# Patient Record
Sex: Male | Born: 1970 | Race: White | Hispanic: No | Marital: Married | Smoking: Former smoker
Health system: Southern US, Community
[De-identification: ages and names within clinical notes are randomized; demographics above are authoritative.]

## PROBLEM LIST (undated history)

## (undated) DIAGNOSIS — M1990 Unspecified osteoarthritis, unspecified site: Secondary | ICD-10-CM

## (undated) DIAGNOSIS — M549 Dorsalgia, unspecified: Secondary | ICD-10-CM

## (undated) DIAGNOSIS — B0089 Other herpesviral infection: Secondary | ICD-10-CM

## (undated) HISTORY — DX: Unspecified osteoarthritis, unspecified site: M19.90

## (undated) HISTORY — PX: TONSILLECTOMY: SUR1361

---

## 2001-12-20 ENCOUNTER — Emergency Department (HOSPITAL_COMMUNITY): Admission: EM | Admit: 2001-12-20 | Discharge: 2001-12-20 | Payer: Self-pay | Admitting: *Deleted

## 2009-12-19 ENCOUNTER — Emergency Department (HOSPITAL_COMMUNITY): Admission: EM | Admit: 2009-12-19 | Discharge: 2009-12-19 | Payer: Self-pay | Admitting: Emergency Medicine

## 2010-07-22 ENCOUNTER — Emergency Department (HOSPITAL_COMMUNITY)
Admission: EM | Admit: 2010-07-22 | Discharge: 2010-07-22 | Disposition: A | Discharge status: Home / Self Care | Payer: Self-pay | Attending: Emergency Medicine | Admitting: Emergency Medicine

## 2010-07-22 DIAGNOSIS — T2124XA Burn of second degree of lower back, initial encounter: Secondary | ICD-10-CM | POA: Insufficient documentation

## 2010-07-22 DIAGNOSIS — X12XXXA Contact with other hot fluids, initial encounter: Secondary | ICD-10-CM | POA: Insufficient documentation

## 2010-07-22 DIAGNOSIS — T2121XA Burn of second degree of chest wall, initial encounter: Secondary | ICD-10-CM | POA: Insufficient documentation

## 2010-07-22 DIAGNOSIS — Y998 Other external cause status: Secondary | ICD-10-CM | POA: Insufficient documentation

## 2010-07-22 DIAGNOSIS — Y92009 Unspecified place in unspecified non-institutional (private) residence as the place of occurrence of the external cause: Secondary | ICD-10-CM | POA: Insufficient documentation

## 2012-05-28 ENCOUNTER — Emergency Department (HOSPITAL_COMMUNITY)
Admission: EM | Admit: 2012-05-28 | Discharge: 2012-05-28 | Disposition: A | Discharge status: Home / Self Care | Payer: Self-pay | Attending: Emergency Medicine | Admitting: Emergency Medicine

## 2012-05-28 ENCOUNTER — Encounter (HOSPITAL_COMMUNITY): Payer: Self-pay

## 2012-05-28 DIAGNOSIS — M5431 Sciatica, right side: Secondary | ICD-10-CM

## 2012-05-28 DIAGNOSIS — F17200 Nicotine dependence, unspecified, uncomplicated: Secondary | ICD-10-CM | POA: Insufficient documentation

## 2012-05-28 DIAGNOSIS — M5430 Sciatica, unspecified side: Secondary | ICD-10-CM | POA: Insufficient documentation

## 2012-05-28 HISTORY — DX: Dorsalgia, unspecified: M54.9

## 2012-05-28 MED ORDER — OXYCODONE-ACETAMINOPHEN 5-325 MG PO TABS: 1.0000 | ORAL_TABLET | ORAL | Status: DC | PRN

## 2012-05-28 MED ORDER — HYDROMORPHONE HCL PF 1 MG/ML IJ SOLN
1.0000 mg | Freq: Once | INTRAMUSCULAR | Status: AC
  Administered 2012-05-28: 1 mg via INTRAMUSCULAR
  Filled 2012-05-28: qty 1

## 2012-05-28 MED ORDER — CYCLOBENZAPRINE HCL 5 MG PO TABS: 5.0000 mg | ORAL_TABLET | Freq: Three times a day (TID) | ORAL | Status: DC | PRN

## 2012-05-28 MED ORDER — IBUPROFEN 600 MG PO TABS: 600.0000 mg | ORAL_TABLET | Freq: Four times a day (QID) | ORAL | Status: DC | PRN

## 2012-05-28 NOTE — ED Notes (Signed)
Pt denies any urinary s/s.

## 2012-05-28 NOTE — ED Notes (Signed)
Pt reports low back pain, thinks may by his sciatica acting up, denies any recent injury

## 2012-05-28 NOTE — ED Provider Notes (Signed)
History     CSN: [id number]  Arrival date & time 05/28/12  1507   First MD Initiated Contact with Patient 05/28/12 1759      Chief Complaint  Patient presents with  . Back Pain    (Consider location/radiation/quality/duration/timing/severity/associated sxs/prior treatment) Patient is a 42 y.o. male presenting with back pain. The history is provided by the patient and the spouse.  Back Pain Location:  Lumbar spine Quality:  Aching and shooting Radiates to:  R knee Pain severity:  Severe Pain is:  Same all the time Onset quality:  Gradual (Patient woke with pain 2 days ago.  He denies injury) Timing:  Constant Progression:  Worsening Chronicity:  Recurrent Relieved by: Pain is improved with walking and standing but not resolved. Worsened by:  Sitting and lying down (Lying still or sitting in one position for any length of time worsens the pain.) Ineffective treatments:  NSAIDs Associated symptoms: no abdominal pain, no bladder incontinence, no bowel incontinence, no chest pain, no dysuria, no fever, no numbness, no paresthesias, no perianal numbness, no tingling and no weakness   Associated symptoms comment:  Np bladder or urinary retention  Risk factors: no hx of cancer     Past Medical History  Diagnosis Date  . Back pain     Past Surgical History  Procedure Laterality Date  . Tonsillectomy      No family history on file.  History  Substance Use Topics  . Smoking status: Current Every Day Smoker    Types: Cigarettes  . Smokeless tobacco: Not on file  . Alcohol Use: Yes      Review of Systems  Constitutional: Negative for fever.  Respiratory: Negative for shortness of breath.   Cardiovascular: Negative for chest pain and leg swelling.  Gastrointestinal: Negative for abdominal pain, constipation, abdominal distention and bowel incontinence.  Genitourinary: Negative for bladder incontinence, dysuria, urgency, frequency, flank pain and difficulty urinating.   Musculoskeletal: Positive for back pain. Negative for joint swelling and gait problem.  Skin: Negative for rash.  Neurological: Negative for tingling, weakness, numbness and paresthesias.    Allergies  Review of patient's allergies indicates no known allergies.  Home Medications   Current Outpatient Rx  Name  Route  Sig  Dispense  Refill  . cyclobenzaprine (FLEXERIL) 5 MG tablet   Oral   Take 1 tablet (5 mg total) by mouth 3 (three) times daily as needed for muscle spasms.   15 tablet   0   . ibuprofen (ADVIL,MOTRIN) 600 MG tablet   Oral   Take 1 tablet (600 mg total) by mouth every 6 (six) hours as needed for pain.   30 tablet   0   . oxyCODONE-acetaminophen (PERCOCET/ROXICET) 5-325 MG per tablet   Oral   Take 1 tablet by mouth every 4 (four) hours as needed for pain.   20 tablet   0     BP 148/88  Pulse 77  Temp(Src) 97.7 F (36.5 C) (Oral)  Resp 20  Ht 5\' 11"  (1.803 m)  Wt 185 lb (83.915 kg)  BMI 25.81 kg/m2  SpO2 98%  Physical Exam  Nursing note and vitals reviewed. Constitutional: He appears well-developed and well-nourished.  HENT:  Head: Normocephalic.  Eyes: Conjunctivae are normal.  Neck: Normal range of motion. Neck supple.  Cardiovascular: Normal rate and intact distal pulses.   Pedal pulses normal.  Pulmonary/Chest: Effort normal.  Abdominal: Soft. Bowel sounds are normal. He exhibits no distension and no mass.  Musculoskeletal: Normal  range of motion. He exhibits tenderness. He exhibits no edema.       Lumbar back: He exhibits tenderness. He exhibits no bony tenderness, no swelling, no edema and no spasm.  Right SI joint ttp.  Neurological: He is alert. He has normal strength. He displays no atrophy and no tremor. No sensory deficit. Gait normal.  Reflex Scores:      Patellar reflexes are 2+ on the right side and 2+ on the left side.      Achilles reflexes are 2+ on the right side and 2+ on the left side. No strength deficit noted in hip and  knee flexor and extensor muscle groups.  Ankle flexion and extension intact.  Skin: Skin is warm and dry.  Psychiatric: He has a normal mood and affect.    ED Course  Procedures (including critical care time)  Labs Reviewed - No data to display No results found.   1. Sciatica, right       MDM  No neuro deficit on exam or by history to suggest emergent or surgical presentation.  Also discussed worsened sx that should prompt immediate re-evaluation including distal weakness, bowel/bladder retention/incontinence.  Pt prescribed ibuprofen,  Flexeril, percocet.  Encouraged heat,  Avoid activity that worsens pain.  Recheck in 1 week if not improved.              Burgess Amor, PA-C 05/28/12 1843

## 2012-05-28 NOTE — ED Provider Notes (Signed)
Medical screening examination/treatment/procedure(s) were performed by non-physician practitioner and as supervising physician I was immediately available for consultation/collaboration.   Shelda Jakes, MD 05/28/12 [phone]

## 2013-04-30 ENCOUNTER — Encounter (HOSPITAL_COMMUNITY): Payer: Self-pay | Admitting: Emergency Medicine

## 2013-04-30 ENCOUNTER — Emergency Department (HOSPITAL_COMMUNITY)
Admission: EM | Admit: 2013-04-30 | Discharge: 2013-04-30 | Disposition: A | Discharge status: Home / Self Care | Payer: Self-pay | Attending: Emergency Medicine | Admitting: Emergency Medicine

## 2013-04-30 DIAGNOSIS — R21 Rash and other nonspecific skin eruption: Secondary | ICD-10-CM | POA: Insufficient documentation

## 2013-04-30 DIAGNOSIS — Z79899 Other long term (current) drug therapy: Secondary | ICD-10-CM | POA: Insufficient documentation

## 2013-04-30 DIAGNOSIS — M7989 Other specified soft tissue disorders: Secondary | ICD-10-CM | POA: Insufficient documentation

## 2013-04-30 DIAGNOSIS — Z792 Long term (current) use of antibiotics: Secondary | ICD-10-CM | POA: Insufficient documentation

## 2013-04-30 DIAGNOSIS — F17200 Nicotine dependence, unspecified, uncomplicated: Secondary | ICD-10-CM | POA: Insufficient documentation

## 2013-04-30 DIAGNOSIS — Z791 Long term (current) use of non-steroidal anti-inflammatories (NSAID): Secondary | ICD-10-CM | POA: Insufficient documentation

## 2013-04-30 MED ORDER — NAPROXEN 250 MG PO TABS
500.0000 mg | ORAL_TABLET | Freq: Once | ORAL | Status: AC
  Administered 2013-04-30: 500 mg via ORAL
  Filled 2013-04-30: qty 2

## 2013-04-30 MED ORDER — NAPROXEN 500 MG PO TABS: 500.0000 mg | ORAL_TABLET | Freq: Two times a day (BID) | ORAL | Status: DC

## 2013-04-30 MED ORDER — SULFAMETHOXAZOLE-TRIMETHOPRIM 800-160 MG PO TABS: 1.0000 | ORAL_TABLET | Freq: Two times a day (BID) | ORAL | Status: DC

## 2013-04-30 MED ORDER — TRAMADOL HCL 50 MG PO TABS: 50.0000 mg | ORAL_TABLET | Freq: Four times a day (QID) | ORAL | Status: DC | PRN

## 2013-04-30 NOTE — ED Provider Notes (Signed)
CSN: [id number]     Arrival date & time 04/30/13  0522 History   First MD Initiated Contact with Patient 04/30/13 [phone]     Chief Complaint  Patient presents with  . Leg Pain   (Consider location/radiation/quality/duration/timing/severity/associated sxs/prior Treatment) HPI Comments: Pt presents with RLE swelling distal leg - X 10 days - gradual onset, persistent, worse with ambulation - walks 3-4 miles per day at work.  No injuries, no fevers, slight red rash over the area.  No fever.  No other sig PMH.  No NSAID relief pta.  He denies any other risk factors for deep venous thrombosis  Patient is a 43 y.o. male presenting with leg pain. The history is provided by the patient.  Leg Pain   Past Medical History  Diagnosis Date  . Back pain    Past Surgical History  Procedure Laterality Date  . Tonsillectomy     No family history on file. History  Substance Use Topics  . Smoking status: Current Every Day Smoker    Types: Cigarettes  . Smokeless tobacco: Not on file  . Alcohol Use: Yes    Review of Systems  Cardiovascular: Positive for leg swelling.  Musculoskeletal: Positive for gait problem.  Skin: Positive for rash.    Allergies  Review of patient's allergies indicates no known allergies.  Home Medications   Current Outpatient Rx  Name  Route  Sig  Dispense  Refill  . cyclobenzaprine (FLEXERIL) 5 MG tablet   Oral   Take 1 tablet (5 mg total) by mouth 3 (three) times daily as needed for muscle spasms.   15 tablet   0   . ibuprofen (ADVIL,MOTRIN) 600 MG tablet   Oral   Take 1 tablet (600 mg total) by mouth every 6 (six) hours as needed for pain.   30 tablet   0   . naproxen (NAPROSYN) 500 MG tablet   Oral   Take 1 tablet (500 mg total) by mouth 2 (two) times daily with a meal.   30 tablet   0   . oxyCODONE-acetaminophen (PERCOCET/ROXICET) 5-325 MG per tablet   Oral   Take 1 tablet by mouth every 4 (four) hours as needed for pain.   20 tablet   0   .  sulfamethoxazole-trimethoprim (SEPTRA DS) 800-160 MG per tablet   Oral   Take 1 tablet by mouth every 12 (twelve) hours.   20 tablet   0   . traMADol (ULTRAM) 50 MG tablet   Oral   Take 1 tablet (50 mg total) by mouth every 6 (six) hours as needed.   15 tablet   0    BP 130/88  Pulse 69  Temp(Src) 98.7 F (37.1 C) (Oral)  Resp 18  Ht 5\' 11"  (1.803 m)  Wt 185 lb (83.915 kg)  BMI 25.81 kg/m2  SpO2 100% Physical Exam  Nursing note and vitals reviewed. Constitutional: He appears well-developed and well-nourished. No distress.  HENT:  Head: Normocephalic and atraumatic.  Eyes: Conjunctivae are normal. Right eye exhibits no discharge. Left eye exhibits no discharge. No scleral icterus.  Cardiovascular: Normal rate and regular rhythm.   Normal pulses at the dorsalis pedis and posterior tibial arteries bilaterally, normal capillary refill  Pulmonary/Chest: Effort normal and breath sounds normal.  Musculoskeletal: Normal range of motion. He exhibits edema (scant pitting edema to the right lower extremity from the mid shin through the ankle, no edema to the left lower extremity).  Neurological: He is alert. Coordination normal.  Normal straight leg raise bilaterally, normal strength at the hips knees and ankles bilaterally, clear speech  Skin: Skin is warm and dry. There is erythema ( Slight erythema overlying the right distal anterior lower extremity just above the ankle, no induration, no increased warmth, no petechiae or purpura).    ED Course  Procedures (including critical care time) Labs Review Labs Reviewed - No data to display Imaging Review No results found.  EKG Interpretation   None       MDM   1. Swelling of right lower extremity    I have personally performed a bedside ultrasound compressibility study of the veins of the right lower extremity. He has no obvious DVT in his right common femoral, superficial femoral, femoral artery tracking all the way to the  knee and from the knee through the cath. These veins appear normal, no clot seen, minimal tenderness with manipulation of the ankle and right lower extremity. At this time I am unsure of the exact etiology of the patient's pain however he has not had any bony injury, his compartments are soft and not consistent with a compartment syndrome, this could be early cellulitis, will recommend minimal weightbearing for the next couple of days, rice therapy, antibiotics if redness or fevers develop, followup with family doctor for recheck. Patient is in agreement with the plan  Meds given in ED:  Medications  naproxen (NAPROSYN) tablet 500 mg (500 mg Oral Given 04/30/13 0637)    Discharge Medication List as of 04/30/2013  6:17 AM    START taking these medications   Details  naproxen (NAPROSYN) 500 MG tablet Take 1 tablet (500 mg total) by mouth 2 (two) times daily with a meal., Starting 04/30/2013, Until Discontinued, Print    sulfamethoxazole-trimethoprim (SEPTRA DS) 800-160 MG per tablet Take 1 tablet by mouth every 12 (twelve) hours., Starting 04/30/2013, Until Discontinued, Print    traMADol (ULTRAM) 50 MG tablet Take 1 tablet (50 mg total) by mouth every 6 (six) hours as needed., Starting 04/30/2013, Until Discontinued, Print            Wesley Roller, MD 05/01/13 [phone]

## 2013-04-30 NOTE — Discharge Instructions (Signed)
Your leg is slightly swollen - we are unsure of the exact cause of the swelling at this time but if it continues to swell or worsens, if you develop fevers or redness, start taking the antibiotic and see your family doctor for a recheck.  I have done a bedside limited ultrasound and i see no signs of blood clot.  Please follow RICE therapy guidelines attached, walk with the special boot when needed, use naprosyn twice a day.  Please call your doctor for a followup appointment within 24-48 hours. When you talk to your doctor please let them know that you were seen in the emergency department and have them acquire all of your records so that they can discuss the findings with you and formulate a treatment plan to fully care for your new and ongoing problems.  [facility]    Kari BaarsEdward Hawkins MD. Specialty: Pulmonary Disease Contact information: [address]  [address]  [place] [address]  [phone]   Syliva OvermanMargaret Simpson, MD. Specialty: [facility] Contact information: [address]  [place] [address]  [phone]   Lilyan PuntScott Luking, MD. Specialty: [facility] Contact information: [address]  [address]  [place] [address]  [phone]   Avon Gullyesfaye Fanta, MD Specialty: Internal Medicine Contact information: [address]  [place] [address]  [phone]   Catalina PizzaZach Hall, MD. Specialty: Internal Medicine Contact information: [address]  [place] [address]  [phone]   Butch PennyAngus Mcinnis, MD. Specialty: Family Medicine Contact information: [address]  [place] [address]  [phone]   John GiovanniStephen Knowlton, MD. Specialty: [facility] Contact information: [address]  [address]  [place] [address]  [phone]   Carylon Perchesoy Fagan, MD. Specialty: Internal Medicine Contact information: [address]  [address]  [place] [address]  [phone]

## 2013-04-30 NOTE — ED Notes (Signed)
Right leg pain that started about 10 days ago per pt. Hurts in the lower leg per pt.

## 2013-12-07 ENCOUNTER — Encounter (HOSPITAL_COMMUNITY): Payer: Self-pay | Admitting: Emergency Medicine

## 2013-12-07 ENCOUNTER — Emergency Department (HOSPITAL_COMMUNITY)
Admission: EM | Admit: 2013-12-07 | Discharge: 2013-12-07 | Disposition: A | Discharge status: Home / Self Care | Payer: Self-pay | Attending: Emergency Medicine | Admitting: Emergency Medicine

## 2013-12-07 DIAGNOSIS — Z791 Long term (current) use of non-steroidal anti-inflammatories (NSAID): Secondary | ICD-10-CM | POA: Insufficient documentation

## 2013-12-07 DIAGNOSIS — B0089 Other herpesviral infection: Secondary | ICD-10-CM

## 2013-12-07 DIAGNOSIS — B0230 Zoster ocular disease, unspecified: Secondary | ICD-10-CM | POA: Insufficient documentation

## 2013-12-07 DIAGNOSIS — R21 Rash and other nonspecific skin eruption: Secondary | ICD-10-CM | POA: Insufficient documentation

## 2013-12-07 DIAGNOSIS — Z792 Long term (current) use of antibiotics: Secondary | ICD-10-CM | POA: Insufficient documentation

## 2013-12-07 DIAGNOSIS — F17200 Nicotine dependence, unspecified, uncomplicated: Secondary | ICD-10-CM | POA: Insufficient documentation

## 2013-12-07 HISTORY — DX: Other herpesviral infection: B00.89

## 2013-12-07 MED ORDER — FLUORESCEIN SODIUM 1 MG OP STRP
1.0000 | ORAL_STRIP | Freq: Once | OPHTHALMIC | Status: AC
  Administered 2013-12-07: 1 via OPHTHALMIC
  Filled 2013-12-07: qty 1

## 2013-12-07 MED ORDER — ACYCLOVIR 800 MG PO TABS: 800.0000 mg | ORAL_TABLET | Freq: Every day | ORAL | Status: DC

## 2013-12-07 MED ORDER — TETRACAINE HCL 0.5 % OP SOLN
1.0000 [drp] | Freq: Once | OPHTHALMIC | Status: AC
  Administered 2013-12-07: 1 [drp] via OPHTHALMIC
  Filled 2013-12-07: qty 2

## 2013-12-07 NOTE — Discharge Instructions (Signed)
Viral Exanthems, Adult Many viral infections of the skin are called viral exanthems. Exanthem is another name for a rash or skin eruption. The most common viral exanthems include the following:  [place] measles or rubella.  Measles or rubeola.  Roseola.  Parvovirus B19 (Erythema infectiosum or Fifth disease).  Chickenpox or varicella. DIAGNOSIS  Sometimes, other problems may cause a rash that looks like a viral exanthem. Most often, your caregiver can determine whether you have a viral exanthem by looking at the rash. They usually have distinct patterns or appearance. Lab work may be done if the diagnosis is uncertain. Sometimes, a small tissue sample (biopsy) of the rash may need to be taken. TREATMENT  Immunizations have led to a decrease in the number of cases of measles, mumps, and rubella. Viral exanthems may require clinical treatment if a bacterial infection or other problems follow. The rash may be associated with:  Minor sore throat.  Aches and pains.  Runny nose.  Watery eyes.  Tiredness.  Some coughs.  Gastrointestinal infections causing nausea, vomiting, and diarrhea. Viral exanthems do not respond to antibiotic medicines, because they are not caused by bacteria. HOME CARE INSTRUCTIONS   Only take over-the-counter or prescription medicines for pain, discomfort, diarrhea, or fever as directed by your caregiver.  Drink enough water and fluids to keep your urine clear or pale yellow. SEEK MEDICAL CARE IF:  You develop swollen neck glands. This may feel like lumps or bumps in the neck.  You develop tenderness over your sinuses.  You are not feeling partly better after 3 days.  You develop muscle aches.  You are feeling more tired than you would expect.  You get a persistent cough with mucus. SEEK IMMEDIATE MEDICAL CARE IF:   You have a fever.  You develop worsened red eyes or eye pain.  You develop sores in your mouth and difficulty drinking or  eating.  You develop a sore throat with pus and difficulty swallowing.  You develop neck pain or a stiff neck.  You develop a severe headache.  You develop vomiting that will not stop. Document Released: 05/29/2002 Document Revised: 05/31/2011 Document Reviewed: 05/26/2010 [facility] Patient Information 2015 [place], [place]. This information is not intended to replace advice given to you by your health care provider. Make sure you discuss any questions you have with your health care provider.

## 2013-12-07 NOTE — ED Notes (Addendum)
Pt reports rash to face since Tuesday. Pt reports history of same and reports "has herpetic lesion outbreaks." nad noted. Pt alert and oriented. Airway patent.

## 2013-12-07 NOTE — ED Notes (Signed)
Left eye  20/40  Rt eye 20/15  Both eyes 20/15   With glasses

## 2013-12-07 NOTE — ED Notes (Signed)
Rash to forehead with redness to eyes. Pt says he has had similar outbreak several years ago that was "herpetic" and treated with famvir.  ? .

## 2013-12-08 NOTE — ED Provider Notes (Signed)
CSN: [id number]     Arrival date & time 12/07/13  1706 History   First MD Initiated Contact with Patient 12/07/13 1801     Chief Complaint  Patient presents with  . Rash     (Consider location/radiation/quality/duration/timing/severity/associated sxs/prior Treatment) The history is provided by the patient.   Wesley Banks is a 43 y.o. 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  He describes a blister type lesion that pops, drains a clear fluid then scabs over and has been present for the past 3 days.  He reports a similar outbreak like this several years ago and was diagnosed with a viral infection which got better with famvir.  He denies fevers or chills, no headache.  He has bilateral eye redness and slight irritation as well, denies eye pain and has had no visual changes.  He has taken no medicines for this complaint.     Past Medical History  Diagnosis Date  . Back pain   . Herpetic dermatitis    Past Surgical History  Procedure Laterality Date  . Tonsillectomy     History reviewed. No pertinent family history. History  Substance Use Topics  . Smoking status: Current Every Day Smoker -- 1.00 packs/day    Types: Cigarettes  . Smokeless tobacco: Not on file  . Alcohol Use: Yes     Comment: occ    Review of Systems  Constitutional: Negative for fever and chills.  Eyes: Positive for redness. Negative for visual disturbance.  Respiratory: Negative for shortness of breath and wheezing.   Skin: Positive for rash.  Neurological: Negative for numbness.      Allergies  Review of patient's allergies indicates no known allergies.  Home Medications   Prior to Admission medications   Medication Sig Start Date End Date Taking? Authorizing Provider  acyclovir (ZOVIRAX) 800 MG tablet Take 1 tablet (800 mg total) by  mouth 5 (five) times daily. 12/07/13   Burgess Amor, PA-C  cyclobenzaprine (FLEXERIL) 5 MG tablet Take 1 tablet (5 mg total) by mouth 3 (three) times daily as needed for muscle spasms. 05/28/12   Burgess Amor, PA-C  ibuprofen (ADVIL,MOTRIN) 600 MG tablet Take 1 tablet (600 mg total) by mouth every 6 (six) hours as needed for pain. 05/28/12   Burgess Amor, PA-C  naproxen (NAPROSYN) 500 MG tablet Take 1 tablet (500 mg total) by mouth 2 (two) times daily with a meal. 04/30/13   [name], MD  oxyCODONE-acetaminophen (PERCOCET/ROXICET) 5-325 MG per tablet Take 1 tablet by mouth every 4 (four) hours as needed for pain. 05/28/12   Burgess Amor, PA-C  sulfamethoxazole-trimethoprim (SEPTRA DS) 800-160 MG per tablet Take 1 tablet by mouth every 12 (twelve) hours. 04/30/13   [name], MD  traMADol (ULTRAM) 50 MG tablet Take 1 tablet (50 mg total) by mouth every 6 (six) hours as needed. 04/30/13   [name], MD   BP 153/96  Pulse 75  Temp(Src) 97.8 F (36.6 C) (Oral)  Resp 16  SpO2 96% Physical Exam  Constitutional: He appears well-developed and well-nourished. No distress.  HENT:  Head: Normocephalic.  Eyes: EOM are normal. Pupils are equal, round, and reactive to light. Right eye exhibits no chemosis and no discharge. Left eye exhibits no chemosis and no discharge. Right conjunctiva is injected. Left conjunctiva is injected.  Slit lamp exam:  The right eye shows no corneal abrasion, no corneal flare, no corneal ulcer, no fluorescein uptake and no anterior chamber bulge.       The left eye shows no corneal abrasion, no corneal flare, no corneal ulcer, no fluorescein uptake and no anterior chamber bulge.  Slight bilateral conjunctival injection. No corneal ulcers/dendritic lesions.Left eye  20/40  Rt eye 20/15  Both eyes 20/15  Corrected.   Neck: Neck supple.  Cardiovascular: Normal rate.   Pulmonary/Chest: Effort normal. He has no wheezes.  Musculoskeletal: Normal range of motion. He exhibits no  edema.  Skin: Rash noted. Rash is papular.  Small raised papules, scabbed, in clusters bilateral forehead above brows, similar appearance on right cheek.  No surrounding erythema, no drainage.  No vesicles noted.    ED Course  Procedures (including critical care time) Labs Review Labs Reviewed - No data to display  Imaging Review No results found.   EKG Interpretation None      MDM   Final diagnoses:  Herpetic dermatitis    Discussed with Dr Patria Mane.  Given patients past similar symptoms, will treat as viral infection.  Prescribed acyclovir.  Encouraged obtaining pcp - resources given.  In the interim,  Advised return here for any worsened sx.    Burgess Amor, PA-C 12/08/13 1710

## 2013-12-09 NOTE — ED Provider Notes (Signed)
Medical screening examination/treatment/procedure(s) were performed by non-physician practitioner and as supervising physician I was immediately available for consultation/collaboration.   EKG Interpretation None        Lyanne Co, MD 12/09/13 [phone]

## 2017-08-06 ENCOUNTER — Emergency Department (HOSPITAL_COMMUNITY): Payer: 59

## 2017-08-06 ENCOUNTER — Emergency Department (HOSPITAL_COMMUNITY)
Admission: EM | Admit: 2017-08-06 | Discharge: 2017-08-06 | Disposition: A | Discharge status: Home / Self Care | Payer: 59 | Attending: Emergency Medicine | Admitting: Emergency Medicine

## 2017-08-06 ENCOUNTER — Encounter (HOSPITAL_COMMUNITY): Payer: Self-pay | Admitting: Emergency Medicine

## 2017-08-06 ENCOUNTER — Other Ambulatory Visit: Payer: Self-pay

## 2017-08-06 DIAGNOSIS — Z87891 Personal history of nicotine dependence: Secondary | ICD-10-CM | POA: Diagnosis not present

## 2017-08-06 DIAGNOSIS — R1031 Right lower quadrant pain: Secondary | ICD-10-CM | POA: Diagnosis present

## 2017-08-06 DIAGNOSIS — N200 Calculus of kidney: Secondary | ICD-10-CM

## 2017-08-06 DIAGNOSIS — Z79899 Other long term (current) drug therapy: Secondary | ICD-10-CM | POA: Insufficient documentation

## 2017-08-06 LAB — COMPREHENSIVE METABOLIC PANEL
ALK PHOS: 57 U/L (ref 38–126)
ALT: 26 U/L (ref 17–63)
AST: 29 U/L (ref 15–41)
Albumin: 4.8 g/dL (ref 3.5–5.0)
Anion gap: 10 (ref 5–15)
BUN: 24 mg/dL — AB (ref 6–20)
CALCIUM: 8.7 mg/dL — AB (ref 8.9–10.3)
CO2: 26 mmol/L (ref 22–32)
CREATININE: 1.29 mg/dL — AB (ref 0.61–1.24)
Chloride: 99 mmol/L — ABNORMAL LOW (ref 101–111)
Glucose, Bld: 85 mg/dL (ref 65–99)
Potassium: 3.7 mmol/L (ref 3.5–5.1)
Sodium: 135 mmol/L (ref 135–145)
Total Bilirubin: 1.2 mg/dL (ref 0.3–1.2)
Total Protein: 7.3 g/dL (ref 6.5–8.1)

## 2017-08-06 LAB — CBC
HCT: 43.9 % (ref 39.0–52.0)
Hemoglobin: 15.2 g/dL (ref 13.0–17.0)
MCH: 30.9 pg (ref 26.0–34.0)
MCHC: 34.6 g/dL (ref 30.0–36.0)
MCV: 89.2 fL (ref 78.0–100.0)
PLATELETS: 279 10*3/uL (ref 150–400)
RBC: 4.92 MIL/uL (ref 4.22–5.81)
RDW: 12.5 % (ref 11.5–15.5)
WBC: 12.5 10*3/uL — AB (ref 4.0–10.5)

## 2017-08-06 LAB — URINALYSIS, ROUTINE W REFLEX MICROSCOPIC
Bacteria, UA: NONE SEEN
Bilirubin Urine: NEGATIVE
GLUCOSE, UA: NEGATIVE mg/dL
Ketones, ur: 20 mg/dL — AB
LEUKOCYTES UA: NEGATIVE
Nitrite: NEGATIVE
PH: 6 (ref 5.0–8.0)
Protein, ur: NEGATIVE mg/dL
RBC / HPF: >50 RBC/hpf — ABNORMAL HIGH (ref 0–5)
SPECIFIC GRAVITY, URINE: 1.033 — AB (ref 1.005–1.030)

## 2017-08-06 LAB — LIPASE, BLOOD: LIPASE: 29 U/L (ref 11–51)

## 2017-08-06 MED ORDER — IOPAMIDOL (ISOVUE-300) INJECTION 61%
100.0000 mL | Freq: Once | INTRAVENOUS | Status: AC | PRN
  Administered 2017-08-06: 100 mL via INTRAVENOUS

## 2017-08-06 MED ORDER — ONDANSETRON HCL 8 MG PO TABS: 8.0000 mg | ORAL_TABLET | ORAL | 0 refills | Status: DC | PRN

## 2017-08-06 MED ORDER — OXYCODONE-ACETAMINOPHEN 5-325 MG PO TABS: 1.0000 | ORAL_TABLET | ORAL | 0 refills | Status: DC | PRN

## 2017-08-06 MED ORDER — TAMSULOSIN HCL 0.4 MG PO CAPS: 0.4000 mg | ORAL_CAPSULE | Freq: Every day | ORAL | 0 refills | Status: DC

## 2017-08-06 NOTE — ED Triage Notes (Signed)
RLQ radiates to rt flank since 2300 last night, denies vomiting but does report nausea. Pt sent by Dr Margo Aye for r/o appendicitis

## 2017-08-06 NOTE — ED Notes (Signed)
Pt verbalized understanding of no driving and to use caution within 4 hours of taking pain meds due to meds cause drowsiness 

## 2017-08-06 NOTE — Discharge Instructions (Addendum)
You have 2 kidney stones on the right side, 4 mm and 6 mm.  I spoke with the urologist on call.  They will call you on Monday for a follow-up appointment.  Prescription for Flomax which will increase your urinary flow, pain medicine, nausea medicine.  If symptoms worsen over the weekend, recommend [facility] where our urology center is located

## 2017-08-06 NOTE — ED Provider Notes (Signed)
[facility] EMERGENCY DEPARTMENT Provider Note   CSN: [id number] Arrival date & time: 08/06/17  1053     History   Chief Complaint Chief Complaint  Patient presents with  . Abdominal Pain    HPI Wesley Banks is a 47 y.o. male.  Right lower quadrant pain radiating to the right flank since 2300 last night.  He reports dark urine earlier in the week.  He is normally healthy.  No previous history of kidney stone.  No dysuria, fever, sweats, chills.  He was seen at the urgent care center today and transferred to the emergency department.  Severity of pain is moderate.     Past Medical History:  Diagnosis Date  . Back pain   . Herpetic dermatitis     There are no active problems to display for this patient.   Past Surgical History:  Procedure Laterality Date  . TONSILLECTOMY          Home Medications    Prior to Admission medications   Medication Sig Start Date End Date Taking? Authorizing Provider  amphetamine-dextroamphetamine (ADDERALL) 10 MG tablet Take 20 mg by mouth daily with breakfast.   Yes [provider]  ondansetron (ZOFRAN) 8 MG tablet Take 1 tablet (8 mg total) by mouth every 4 (four) hours as needed. 08/06/17   Donnetta Hutching, MD  oxyCODONE-acetaminophen (PERCOCET) 5-325 MG tablet Take 1 tablet by mouth every 4 (four) hours as needed. 08/06/17   Donnetta Hutching, MD  tamsulosin (FLOMAX) 0.4 MG CAPS capsule Take 1 capsule (0.4 mg total) by mouth daily. 08/06/17   Donnetta Hutching, MD    Family History No family history on file.  Social History Social History   Tobacco Use  . Smoking status: Former Smoker    Packs/day: 1.00    Types: Cigarettes  . Smokeless tobacco: Never Used  Substance Use Topics  . Alcohol use: Yes    Comment: occ  . Drug use: No     Allergies   Bee venom   Review of Systems Review of Systems  All other systems reviewed and are negative.    Physical Exam Updated Vital Signs BP (!) 156/99 (BP Location: Right Arm)    Pulse 82   Temp 98 F (36.7 C) (Oral)   Resp 17   Ht  (1.778 m)   Wt 93 kg (205 lb)   SpO2 99%   BMI 29.41 kg/m   Physical Exam  Constitutional: He is oriented to person, place, and time. He appears well-developed and well-nourished.  HENT:  Head: Normocephalic and atraumatic.  Eyes: Conjunctivae are normal.  Neck: Neck supple.  Cardiovascular: Normal rate and regular rhythm.  Pulmonary/Chest: Effort normal and breath sounds normal.  Abdominal: Soft. Bowel sounds are normal.  Tender right lower quadrant  Genitourinary:  Genitourinary Comments: Minimal tenderness right flank.  Musculoskeletal: Normal range of motion.  Neurological: He is alert and oriented to person, place, and time.  Skin: Skin is warm and dry.  Psychiatric: He has a normal mood and affect. His behavior is normal.  Nursing note and vitals reviewed.    ED Treatments / Results  Labs (all labs ordered are listed, but only abnormal results are displayed) Labs Reviewed  COMPREHENSIVE METABOLIC PANEL - Abnormal; Notable for the following components:      Result Value   Chloride 99 (*)    BUN 24 (*)    Creatinine, Ser 1.29 (*)    Calcium 8.7 (*)    All other  components within normal limits  CBC - Abnormal; Notable for the following components:   WBC 12.5 (*)    All other components within normal limits  URINALYSIS, ROUTINE W REFLEX MICROSCOPIC - Abnormal; Notable for the following components:   Specific Gravity, Urine 1.033 (*)    Hgb urine dipstick LARGE (*)    Ketones, ur 20 (*)    RBC / HPF >50 (*)    All other components within normal limits  LIPASE, BLOOD    EKG None  Radiology Ct Abdomen Pelvis W Contrast  Result Date: 08/06/2017 CLINICAL DATA:  Right lower quadrant gland right flank pain since last night. Nausea. EXAM: CT ABDOMEN AND PELVIS WITH CONTRAST TECHNIQUE: Multidetector CT imaging of the abdomen and pelvis was performed using the standard protocol following bolus  administration of intravenous contrast. CONTRAST:  ISOVUE-300 IOPAMIDOL (ISOVUE-300) INJECTION 61% COMPARISON:  None. FINDINGS: Lower chest: Within normal. Hepatobiliary: 1.3 cm hypodensity adjacent the gallbladder fossa within the right lobe of the liver likely small cyst or hemangioma. Gallbladder and biliary tree are normal. Pancreas: Normal. Spleen: Normal. Adrenals/Urinary Tract: Adrenal glands are normal. Kidneys normal size with bilateral nephrolithiasis. Only very minimal prominence of the right intrarenal collecting system. There is moderate stranding and fluid over the right perinephric space. Mild prominence of the right ureter as there are 2 adjacent stones over the distal right ureter just above the UVJ. The more distal stone measures 6 mm and adjacent more proximal stone measures 4 mm. Left ureter and bladder are normal. Stomach/Bowel: Stomach and small bowel are normal. Appendix is normal. Colon is unremarkable. Vascular/Lymphatic: Minimal calcified plaque over the abdominal aorta. No significant adenopathy. Reproductive: Normal. Other: None. Musculoskeletal: Mild degenerate change of the spine with moderate disc disease at the L4-5 level and L5-S1 levels. IMPRESSION: Bilateral nephrolithiasis. Two adjacent stones measuring 4 mm and 6 mm over the distal right ureter at the UVJ causing low-grade obstruction. 1.3 cm right liver hypodensity likely a cyst or hemangioma. Aortic Atherosclerosis (ICD10-I70.0). Electronically Signed   By: Elberta Fortis M.D.   On: 08/06/2017 13:00    Procedures Procedures (including critical care time)  Medications Ordered in ED Medications  iopamidol (ISOVUE-300) 61 % injection 100 mL (100 mLs Intravenous Contrast Given 08/06/17 1239)     Initial Impression / Assessment and Plan / ED Course  I have reviewed the triage vital signs and the nursing notes.  Pertinent labs & imaging results that were available during my care of the patient were reviewed by me  and considered in my medical decision making (see chart for details).     Patient presents with right lower quadrant pain with radiation to the right flank.  Creatinine minimally elevated.  CT scan reveals to distal right ureteral stone, 4 and 6 mm (with mild hydronephrosis).  Discussed with urologist on call.  Patient will be seen on Monday.  Discharge medications Flomax 0.4 mg, Percocet, Zofran 8 mg.  Final Clinical Impressions(s) / ED Diagnoses   Final diagnoses:  Kidney stones    ED Discharge Orders        Ordered    tamsulosin (FLOMAX) 0.4 MG CAPS capsule  Daily     08/06/17 1455    oxyCODONE-acetaminophen (PERCOCET) 5-325 MG tablet  Every 4 hours PRN     08/06/17 1455    ondansetron (ZOFRAN) 8 MG tablet  Every 4 hours PRN     08/06/17 1455       Donnetta Hutching, MD 08/06/17 [phone]

## 2018-09-13 ENCOUNTER — Emergency Department (HOSPITAL_COMMUNITY)
Admission: EM | Admit: 2018-09-13 | Discharge: 2018-09-13 | Disposition: A | Discharge status: Home / Self Care | Payer: 59 | Attending: Emergency Medicine | Admitting: Emergency Medicine

## 2018-09-13 ENCOUNTER — Other Ambulatory Visit: Payer: Self-pay

## 2018-09-13 ENCOUNTER — Emergency Department (HOSPITAL_COMMUNITY): Payer: 59

## 2018-09-13 DIAGNOSIS — Z87891 Personal history of nicotine dependence: Secondary | ICD-10-CM | POA: Diagnosis not present

## 2018-09-13 DIAGNOSIS — Z79899 Other long term (current) drug therapy: Secondary | ICD-10-CM | POA: Insufficient documentation

## 2018-09-13 DIAGNOSIS — R079 Chest pain, unspecified: Secondary | ICD-10-CM

## 2018-09-13 DIAGNOSIS — R0789 Other chest pain: Secondary | ICD-10-CM | POA: Diagnosis not present

## 2018-09-13 LAB — BASIC METABOLIC PANEL
Anion gap: 11 (ref 5–15)
BUN: 12 mg/dL (ref 6–20)
CO2: 28 mmol/L (ref 22–32)
Calcium: 9.3 mg/dL (ref 8.9–10.3)
Chloride: 101 mmol/L (ref 98–111)
Creatinine, Ser: 0.87 mg/dL (ref 0.61–1.24)
GFR calc Af Amer: >60 mL/min (ref >60)
GFR calc non Af Amer: >60 mL/min (ref >60)
Glucose, Bld: 96 mg/dL (ref 70–99)
Potassium: 4.4 mmol/L (ref 3.5–5.1)
Sodium: 140 mmol/L (ref 135–145)

## 2018-09-13 LAB — TROPONIN I (HIGH SENSITIVITY)
Troponin I (High Sensitivity): <2 ng/L (ref <18)
Troponin I (High Sensitivity): 2 ng/L (ref <18)

## 2018-09-13 LAB — CBC WITH DIFFERENTIAL/PLATELET
Abs Immature Granulocytes: 0.03 10*3/uL (ref 0.00–0.07)
Basophils Absolute: 0 10*3/uL (ref 0.0–0.1)
Basophils Relative: 1 %
Eosinophils Absolute: 0.1 10*3/uL (ref 0.0–0.5)
Eosinophils Relative: 2 %
HCT: 45.2 % (ref 39.0–52.0)
Hemoglobin: 15.6 g/dL (ref 13.0–17.0)
Immature Granulocytes: 0 %
Lymphocytes Relative: 28 %
Lymphs Abs: 2.2 10*3/uL (ref 0.7–4.0)
MCH: 31.3 pg (ref 26.0–34.0)
MCHC: 34.5 g/dL (ref 30.0–36.0)
MCV: 90.8 fL (ref 80.0–100.0)
Monocytes Absolute: 0.6 10*3/uL (ref 0.1–1.0)
Monocytes Relative: 7 %
Neutro Abs: 5 10*3/uL (ref 1.7–7.7)
Neutrophils Relative %: 62 %
Platelets: 313 10*3/uL (ref 150–400)
RBC: 4.98 MIL/uL (ref 4.22–5.81)
RDW: 12.5 % (ref 11.5–15.5)
WBC: 8 10*3/uL (ref 4.0–10.5)
nRBC: 0 % (ref 0.0–0.2)

## 2018-09-13 LAB — D-DIMER, QUANTITATIVE: D-Dimer, Quant: 0.51 ug/mL-FEU — ABNORMAL HIGH (ref 0.00–0.50)

## 2018-09-13 MED ORDER — IOHEXOL 350 MG/ML SOLN
100.0000 mL | Freq: Once | INTRAVENOUS | Status: AC | PRN
  Administered 2018-09-13: 100 mL via INTRAVENOUS

## 2018-09-13 NOTE — ED Provider Notes (Signed)
 @[facility] @  Blood pressure (!) 145/87, pulse 64, temperature 97.9 F (36.6 C), resp. rate 19, height 5\' 10"  (1.778 m), weight 90.7 kg, SpO2 99 %.  The patient is calm and cooperative at this time.  There have been no acute events since the last update.  Awaiting disposition plan from Behavioral Medicine team. [facility] Provider Note   CSN: [id number] Arrival date & time: 09/13/18  1135     History   Chief Complaint Chief Complaint  Patient presents with   Chest Pain    HPI Paula ComptonBrian C Lias is a 48 y.o. male.  HPI: A 48 year old patient presents for evaluation of chest pain. Initial onset of pain was more than 6 hours ago. The patient's chest pain is well-localized and is not worse with exertion. The patient's chest pain is middle- or left-sided, is not described as heaviness/pressure/tightness, is not sharp and does not radiate to the arms/jaw/neck. The patient does not complain of nausea and denies diaphoresis. The patient has no history of stroke, has no history of peripheral artery disease, has not smoked in the past 90 days, denies any history of treated diabetes, has no relevant family history of coronary artery disease (first degree relative at less than age 50), is not hypertensive, has no history of hypercholesterolemia and does not have an elevated BMI (>=30).   HPI  Past Medical History:  Diagnosis Date   Back pain    Herpetic dermatitis     There are no active problems to display for this patient.   Past Surgical History:  Procedure Laterality Date   TONSILLECTOMY          Home Medications    Prior to Admission medications   Medication Sig Start Date End Date Taking? Authorizing Provider  amphetamine-dextroamphetamine (ADDERALL) 10 MG tablet Take 20 mg by mouth daily with breakfast.   Yes [provider]  ondansetron (ZOFRAN) 8 MG tablet Take 1 tablet (8 mg total) by mouth every 4 (four) hours as  needed. Patient not taking: Reported on 09/13/2018 08/06/17   Donnetta Hutchingook, Zygmund, MD  oxyCODONE-acetaminophen (PERCOCET) 5-325 MG tablet Take 1 tablet by mouth every 4 (four) hours as needed. Patient not taking: Reported on 09/13/2018 08/06/17   Donnetta Hutchingook, Shigeo, MD  tamsulosin (FLOMAX) 0.4 MG CAPS capsule Take 1 capsule (0.4 mg total) by mouth daily. Patient not taking: Reported on 09/13/2018 08/06/17   Donnetta Hutchingook, Matilde, MD    Family History No family history on file.  Social History Social History   Tobacco Use   Smoking status: Former Smoker    Packs/day: 1.00    Types: Cigarettes   Smokeless tobacco: Never Used  Substance Use Topics   Alcohol use: Yes    Comment: occ   Drug use: No     Allergies   Bee venom   Review of Systems Review of Systems   Physical Exam Updated Vital Signs BP (!) 145/87    Pulse 64    Temp 97.9 F (36.6 C)    Resp 19    Ht 5\' 10"  (1.778 m)    Wt 90.7 kg    SpO2 99%    BMI 28.70 kg/m   Physical Exam   ED Treatments / Results  Labs (all labs ordered are listed, but only abnormal results are displayed) Labs Reviewed  D-DIMER, QUANTITATIVE (NOT AT [facility]) - Abnormal; Notable for the following components:      Result Value   D-Dimer, Quant 0.51 (*)    All  other components within normal limits  TROPONIN I (HIGH SENSITIVITY)  TROPONIN I (HIGH SENSITIVITY)  CBC WITH DIFFERENTIAL/PLATELET  BASIC METABOLIC PANEL    EKG EKG Interpretation  Date/Time:  Wednesday September 13 2018 11:48:44 EDT Ventricular Rate:  62 PR Interval:    QRS Duration: 98 QT Interval:  412 QTC Calculation: 419 R Axis:   24 Text Interpretation:  Sinus rhythm Confirmed by Elnora Morrison [phone]) on 09/13/2018 11:55:16 AM   Radiology Dg Chest 2 View  Result Date: 09/13/2018 CLINICAL DATA:  Left-sided chest pain EXAM: CHEST - 2 VIEW COMPARISON:  None. FINDINGS: The heart size and mediastinal contours are within normal limits. Both lungs are clear. The visualized skeletal structures are  unremarkable. IMPRESSION: No acute abnormality of the lungs.  No focal airspace opacity. Electronically Signed   By: Eddie Candle M.D.   On: 09/13/2018 12:43   Ct Angio Chest Pe W And/or Wo Contrast  Result Date: 09/13/2018 CLINICAL DATA:  Chest pain EXAM: CT ANGIOGRAPHY CHEST WITH CONTRAST TECHNIQUE: Multidetector CT imaging of the chest was performed using the standard protocol during bolus administration of intravenous contrast. Multiplanar CT image reconstructions and MIPs were obtained to evaluate the vascular anatomy. CONTRAST:  180mL OMNIPAQUE IOHEXOL 350 MG/ML SOLN COMPARISON:  Chest radiograph September 13, 2018 FINDINGS: Cardiovascular: There is no demonstrable pulmonary embolus. The ascending thoracic aorta has a measured transverse diameter of 4.1 x 4.1 cm. There is no evident thoracic aortic dissection. The visualized great vessels appear normal. Note that the right innominate and left common carotid arteries arise as a common trunk, an anatomic variant. There is no pericardial effusion or pericardial thickening. There are foci of coronary artery calcification. Mediastinum/Nodes: Thyroid appears unremarkable. There is no appreciable thoracic adenopathy. Esophagus appears normal. Lungs/Pleura: There is no edema or consolidation. There is minimal atelectasis in the left lower lobe. There is no pleural effusion or pleural thickening evident. Upper Abdomen: There is an approximately 1 x 1 cm left adrenal adenoma. Visualized upper abdominal structures otherwise appear unremarkable. Musculoskeletal: There is midthoracic dextroscoliosis. There are no blastic or lytic bone lesions. There are no evident chest wall lesions. Review of the MIP images confirms the above findings. IMPRESSION: 1. There is no demonstrable pulmonary embolus. There is no thoracic aortic aneurysm or dissection. There are foci of coronary artery calcification. 2. Minimal left base atelectasis. No edema or consolidation. No pleural effusion.  3.  No evident thoracic adenopathy. 4.  Approximately 1 cm benign left adrenal adenoma. Electronically Signed   By: Lowella Grip III M.D.   On: 09/13/2018 17:00    Procedures Procedures (including critical care time)  Medications Ordered in ED Medications  iohexol (OMNIPAQUE) 350 MG/ML injection 100 mL (100 mLs Intravenous Contrast Given 09/13/18 1634)     Initial Impression / Assessment and Plan / ED Course  I have reviewed the triage vital signs and the nursing notes.  Pertinent labs & imaging results that were available during my care of the patient were reviewed by me and considered in my medical decision making (see chart for details).     HEAR Score: 2    Final Clinical Impressions(s) / ED Diagnoses   Final diagnoses:  Left-sided chest pain    ED Discharge Orders    None      1730:    Discussed CT angiogram report with patient.  No acute distress at discharge.   Nat Christen, MD 09/13/18 1729

## 2018-09-13 NOTE — ED Triage Notes (Signed)
Pt c/o of left sided chest pain that radiates to left side of back since 9 am yesterday morning

## 2018-09-13 NOTE — Discharge Instructions (Addendum)
CT scan showed no evidence of a pulmonary emboli or blood clot in your lung.  Return to the emergency room for new or worsening symptoms.  Follow-up with your primary doctor for next step in evaluation.  Take Tylenol, Motrin or aspirin for pain.

## 2018-09-13 NOTE — ED Provider Notes (Signed)
[facility] EMERGENCY DEPARTMENT Provider Note   CSN: [id number] Arrival date & time: 09/13/18  1135    History   Chief Complaint Chief Complaint  Patient presents with   Chest Pain    HPI Wesley ComptonBrian C Banks is a 48 y.o. male.  HPI: A 48 year old patient presents for evaluation of chest pain. Initial onset of pain was more than 6 hours ago. The patient's chest pain is well-localized and is not worse with exertion. The patient's chest pain is middle- or left-sided, is not described as heaviness/pressure/tightness, is not sharp and does not radiate to the arms/jaw/neck. The patient does not complain of nausea and denies diaphoresis. The patient has no history of stroke, has no history of peripheral artery disease, has not smoked in the past 90 days, denies any history of treated diabetes, has no relevant family history of coronary artery disease (first degree relative at less than age 50), is not hypertensive, has no history of hypercholesterolemia and does not have an elevated BMI (>=30).   A 48 year old patient presents for evaluation of chest pain. Initial onset of pain was more than 6 hours ago. The patient's chest pain is well-localized and is not worse with exertion. The patient's chest pain is middle- or left-sided, is not described as heaviness/pressure/tightness, is not sharp and does not radiate to the arms/jaw/neck. The patient does not complain of nausea and denies diaphoresis. The patient has no history of stroke, has no history of peripheral artery disease, has not smoked in the past 90 days, denies any history of treated diabetes, has no relevant family history of coronary artery disease (first degree relative at less than age 50), is not hypertensive, has no history of hypercholesterolemia and does not have an elevated BMI (>=30). No tearing sensation.  Worse with movement.  Patient denies cardiac/ACS history, no blood clot history or blood clot risk factors.  No exertional  component.     Past Medical History:  Diagnosis Date   Back pain    Herpetic dermatitis     There are no active problems to display for this patient.   Past Surgical History:  Procedure Laterality Date   TONSILLECTOMY          Home Medications    Prior to Admission medications   Medication Sig Start Date End Date Taking? Authorizing Provider  amphetamine-dextroamphetamine (ADDERALL) 10 MG tablet Take 20 mg by mouth daily with breakfast.   Yes [provider]  ondansetron (ZOFRAN) 8 MG tablet Take 1 tablet (8 mg total) by mouth every 4 (four) hours as needed. Patient not taking: Reported on 09/13/2018 08/06/17   Donnetta Hutchingook, Kwesi, MD  oxyCODONE-acetaminophen (PERCOCET) 5-325 MG tablet Take 1 tablet by mouth every 4 (four) hours as needed. Patient not taking: Reported on 09/13/2018 08/06/17   Donnetta Hutchingook, Chanler, MD  tamsulosin (FLOMAX) 0.4 MG CAPS capsule Take 1 capsule (0.4 mg total) by mouth daily. Patient not taking: Reported on 09/13/2018 08/06/17   Donnetta Hutchingook, Sian, MD    Family History No family history on file.  Social History Social History   Tobacco Use   Smoking status: Former Smoker    Packs/day: 1.00    Types: Cigarettes   Smokeless tobacco: Never Used  Substance Use Topics   Alcohol use: Yes    Comment: occ   Drug use: No     Allergies   Bee venom   Review of Systems Review of Systems  Constitutional: Negative for chills and fever.  HENT: Negative for congestion.  Eyes: Negative for visual disturbance.  Respiratory: Negative for shortness of breath.   Cardiovascular: Positive for chest pain. Negative for leg swelling.  Gastrointestinal: Negative for abdominal pain and vomiting.  Genitourinary: Negative for dysuria and flank pain.  Musculoskeletal: Negative for back pain, neck pain and neck stiffness.  Skin: Negative for rash.  Neurological: Negative for light-headedness and headaches.     Physical Exam Updated Vital Signs BP (!) 145/87     Pulse 64    Temp 97.9 F (36.6 C)    Resp 19    Ht 5\' 10"  (1.778 m)    Wt 90.7 kg    SpO2 99%    BMI 28.70 kg/m   Physical Exam Vitals signs and nursing note reviewed.  Constitutional:      Appearance: He is well-developed.  HENT:     Head: Normocephalic and atraumatic.  Eyes:     General:        Right eye: No discharge.        Left eye: No discharge.     Conjunctiva/sclera: Conjunctivae normal.  Neck:     Musculoskeletal: Normal range of motion and neck supple.     Trachea: No tracheal deviation.  Cardiovascular:     Rate and Rhythm: Normal rate and regular rhythm.  Pulmonary:     Effort: Pulmonary effort is normal.     Breath sounds: Normal breath sounds.  Abdominal:     General: There is no distension.     Palpations: Abdomen is soft.     Tenderness: There is no abdominal tenderness. There is no guarding.  Musculoskeletal:     Right lower leg: No edema.     Left lower leg: No edema.     Comments: Tender to palpation anterior lower rib, no rash  Skin:    General: Skin is warm.     Findings: No rash.  Neurological:     Mental Status: He is alert and oriented to person, place, and time.      ED Treatments / Results  Labs (all labs ordered are listed, but only abnormal results are displayed) Labs Reviewed  D-DIMER, QUANTITATIVE (NOT AT [facility]) - Abnormal; Notable for the following components:      Result Value   D-Dimer, Quant 0.51 (*)    All other components within normal limits  TROPONIN I (HIGH SENSITIVITY)  TROPONIN I (HIGH SENSITIVITY)  CBC WITH DIFFERENTIAL/PLATELET  BASIC METABOLIC PANEL    EKG EKG Interpretation  Date/Time:  Wednesday September 13 2018 11:48:44 EDT Ventricular Rate:  62 PR Interval:    QRS Duration: 98 QT Interval:  412 QTC Calculation: 419 R Axis:   24 Text Interpretation:  Sinus rhythm Confirmed by Blane OharaZavitz, Marciano Mundt [phone] on 09/13/2018 11:55:16 AM   Radiology Dg Chest 2 View  Result Date: 09/13/2018 CLINICAL DATA:  Left-sided  chest pain EXAM: CHEST - 2 VIEW COMPARISON:  None. FINDINGS: The heart size and mediastinal contours are within normal limits. Both lungs are clear. The visualized skeletal structures are unremarkable. IMPRESSION: No acute abnormality of the lungs.  No focal airspace opacity. Electronically Signed   By: Lauralyn PrimesAlex  Bibbey M.D.   On: 09/13/2018 12:43    Procedures Procedures (including critical care time)  Medications Ordered in ED Medications - No data to display   Initial Impression / Assessment and Plan / ED Course  I have reviewed the triage vital signs and the nursing notes.  Pertinent labs & imaging results that were available during my care of  the patient were reviewed by me and considered in my medical decision making (see chart for details).     HEAR Score: 2 Presents with isolated left chest pain since yesterday morning.  Patient is low risk cardiac hear score 2, very low risk of blood clots.  Plan for troponin and d-dimer, screening labs, chest x-ray.  EKG reviewed unremarkable.  Chest x-ray no acute findings.  EKG no signs of ischemia.  Vital signs unremarkable. Second troponin negative.  Patient's care be signed out to follow-up CT scan of the chest to ensure no pulmonary embolism.    Final Clinical Impressions(s) / ED Diagnoses   Final diagnoses:  Left-sided chest pain    ED Discharge Orders    None       Elnora Morrison, MD 09/13/18 1536

## 2020-01-08 ENCOUNTER — Encounter: Payer: Self-pay | Admitting: Nurse Practitioner

## 2020-02-27 ENCOUNTER — Ambulatory Visit (INDEPENDENT_AMBULATORY_CARE_PROVIDER_SITE_OTHER): Payer: 59 | Admitting: Nurse Practitioner

## 2020-02-27 ENCOUNTER — Encounter: Payer: Self-pay | Admitting: Nurse Practitioner

## 2020-02-27 ENCOUNTER — Other Ambulatory Visit: Payer: Self-pay

## 2020-02-27 ENCOUNTER — Telehealth: Payer: Self-pay

## 2020-02-27 DIAGNOSIS — Z0000 Encounter for general adult medical examination without abnormal findings: Secondary | ICD-10-CM

## 2020-02-27 DIAGNOSIS — Z1211 Encounter for screening for malignant neoplasm of colon: Secondary | ICD-10-CM | POA: Diagnosis not present

## 2020-02-27 NOTE — Telephone Encounter (Signed)
Tried to call pt to schedule TCS w/Dr. Marletta Lor ASA 2, no answer, LMOVM for return call.

## 2020-02-27 NOTE — Progress Notes (Signed)
Cc'ed to pcp °

## 2020-02-27 NOTE — Progress Notes (Signed)
Primary Care Physician:  Leslie Andrea, MD Primary Gastroenterologist:  Dr. Abbey Chatters  Chief Complaint  Patient presents with  . Consult    Never had prior TCS    HPI:   Wesley Banks is a 49 y.o. male who presents on referral from primary care to schedule colonoscopy.  Nurse/phone triage was deferred office visit due to medications likely necessitating augmented sedation and potentially other precautions.  Reviewed information provided with referral including primary care office visit dated 12/13/2019 with a recommended referral to GI for scheduling of first ever colonoscopy.  No history of colonoscopy in our system.  Today he states doing okay overall. Has never had a colonoscopy before. Denies abdominal pain, n/V, hematochezia, melena, fever, chills, unintentional weight loss. Denies URI or flu-like symptoms. Denies loss of sense of taste or smell. The patient has received COVID-19 vaccination(s). Denies chest pain, dyspnea, dizziness, lightheadedness, syncope, near syncope. Denies any other upper or lower GI symptoms.  Past Medical History:  Diagnosis Date  . Back pain   . Herpetic dermatitis   . Osteoarthritis     Past Surgical History:  Procedure Laterality Date  . TONSILLECTOMY      Current Outpatient Medications  Medication Sig Dispense Refill  . amphetamine-dextroamphetamine (ADDERALL) 10 MG tablet Take 20-30 mg by mouth daily with breakfast.     . naproxen sodium (ALEVE) 220 MG tablet Take 220 mg by mouth daily as needed.     No current facility-administered medications for this visit.    Allergies as of 02/27/2020 - Review Complete 02/27/2020  Allergen Reaction Noted  . Bee venom Anaphylaxis 08/06/2017    Family History  Problem Relation Age of Onset  . Colon cancer Neg Hx   . Gastric cancer Neg Hx   . Esophageal cancer Neg Hx     Social History   Socioeconomic History  . Marital status: Married    Spouse name: Not on file  . Number of  children: Not on file  . Years of education: Not on file  . Highest education level: Not on file  Occupational History  . Not on file  Tobacco Use  . Smoking status: Former Smoker    Packs/day: 1.00    Types: Cigarettes  . Smokeless tobacco: Never Used  Substance and Sexual Activity  . Alcohol use: Yes    Comment: 12 beers per week  . Drug use: No  . Sexual activity: Yes    Birth control/protection: None  Other Topics Concern  . Not on file  Social History Narrative  . Not on file   Social Determinants of Health   Financial Resource Strain:   . Difficulty of Paying Living Expenses: Not on file  Food Insecurity:   . Worried About Charity fundraiser in the Last Year: Not on file  . Ran Out of Food in the Last Year: Not on file  Transportation Needs:   . Lack of Transportation (Medical): Not on file  . Lack of Transportation (Non-Medical): Not on file  Physical Activity:   . Days of Exercise per Week: Not on file  . Minutes of Exercise per Session: Not on file  Stress:   . Feeling of Stress : Not on file  Social Connections:   . Frequency of Communication with Friends and Family: Not on file  . Frequency of Social Gatherings with Friends and Family: Not on file  . Attends Religious Services: Not on file  . Active Member of Clubs or Organizations:  Not on file  . Attends Archivist Meetings: Not on file  . Marital Status: Not on file  Intimate Partner Violence:   . Fear of Current or Ex-Partner: Not on file  . Emotionally Abused: Not on file  . Physically Abused: Not on file  . Sexually Abused: Not on file    Subjective: Review of Systems  Constitutional: Negative for chills, fever, malaise/fatigue and weight loss.  HENT: Negative for congestion and sore throat.   Respiratory: Negative for cough and shortness of breath.   Cardiovascular: Negative for chest pain and palpitations.  Gastrointestinal: Negative for abdominal pain, blood in stool, constipation,  diarrhea, heartburn, melena, nausea and vomiting.  Musculoskeletal: Negative for joint pain and myalgias.  Skin: Negative for rash.  Neurological: Negative for dizziness and weakness.  Endo/Heme/Allergies: Does not bruise/bleed easily.  Psychiatric/Behavioral: Negative for depression. The patient is not nervous/anxious.   All other systems reviewed and are negative.      Objective: BP 134/85   Pulse 81   Temp (!) 97.5 F (36.4 C)   Ht _0  (1.803 m)   Wt 198 lb 6.4 oz (90 kg)   BMI 27.67 kg/m  Physical Exam Vitals and nursing note reviewed.  Constitutional:      General: He is not in acute distress.    Appearance: Normal appearance. He is normal weight. He is not ill-appearing, toxic-appearing or diaphoretic.  HENT:     Head: Normocephalic and atraumatic.     Nose: No congestion or rhinorrhea.  Eyes:     General: No scleral icterus. Cardiovascular:     Rate and Rhythm: Normal rate and regular rhythm.     Heart sounds: Murmur heard.  Systolic murmur is present with a grade of 2/6.   Pulmonary:     Effort: Pulmonary effort is normal.     Breath sounds: Normal breath sounds.  Abdominal:     General: Bowel sounds are normal. There is no distension.     Palpations: Abdomen is soft. There is no hepatomegaly, splenomegaly or mass.     Tenderness: There is no abdominal tenderness. There is no guarding or rebound.     Hernia: No hernia is present.  Musculoskeletal:     Cervical back: Neck supple.  Skin:    General: Skin is warm and dry.     Coloration: Skin is not jaundiced.     Findings: No bruising or rash.  Neurological:     General: No focal deficit present.     Mental Status: He is alert and oriented to person, place, and time. Mental status is at baseline.  Psychiatric:        Mood and Affect: Mood normal.        Behavior: Behavior normal.        Thought Content: Thought content normal.      Assessment:  Very pleasant 49 year old male has never had a  colonoscopy and was referred by primary care to schedule first ever screening exam.  Generally asymptomatic from a GI standpoint.  No red flag/warning signs or symptoms.  He is on Adderall.  Previously was on pain medication.  No other concerning medications.  We will proceed with scheduling his colonoscopy at this time.  Proceed with TCS with Dr. Abbey Chatters on propofol/MAC in near future: the risks, benefits, and alternatives have been discussed with the patient in detail. The patient states understanding and desires to proceed.  ASA I/II  The patient is not on any anticoagulants, anxiolytics,  chronic pain medications, antidepressants, antidiabetics, or iron supplements.   Plan: 1. Schedule colonoscopy as per above 2. Further recommendations will follow 3. Follow-up based on post procedure recommendations    Thank you for allowing [place] to participate in the care of [place], DNP, AGNP-C Adult & Gerontological Nurse Practitioner [facility]   02/27/2020 2:35 PM   Disclaimer: This note was dictated with voice recognition software. Similar sounding words can inadvertently be transcribed and may not be corrected upon review.

## 2020-02-27 NOTE — Patient Instructions (Signed)
Your health issues we discussed today were:   Need for colonoscopy: 1. We will schedule your colonoscopy for you 2. Further recommendations will be made after your colonoscopy 3. Call us if you have any problems with your bowel prep  Overall I recommend:  1. Continue your other current medications 2. Return for follow-up based on recommendations made after your procedure 3. Call us if you have any questions or concerns   ---------------------------------------------------------------  I am glad you have gotten your COVID-19 vaccination!  Even though you are fully vaccinated you should continue to follow CDC and state/local guidelines.  ---------------------------------------------------------------   At [facility] we value your feedback. You may receive a survey about your visit today. Please share your experience as we strive to create trusting relationships with our patients to provide genuine, compassionate, quality care.  We appreciate your understanding and patience as we review any laboratory studies, imaging, and other diagnostic tests that are ordered as we care for you. Our office policy is 5 business days for review of these results, and any emergent or urgent results are addressed in a timely manner for your best interest. If you do not hear from our office in 1 week, please contact us.   We also encourage the use of MyChart, which contains your medical information for your review as well. If you are not enrolled in this feature, an access code is on this after visit summary for your convenience. Thank you for allowing [place] to be involved in your care.  It was great to see you today!  I hope you have a Merry Christmas and [address]!!

## 2020-02-28 NOTE — Telephone Encounter (Signed)
Tried to call pt, no answer, LMOVM for return call.  

## 2020-02-29 NOTE — Telephone Encounter (Signed)
Letter mailed

## 2020-03-03 ENCOUNTER — Telehealth: Payer: Self-pay | Admitting: Internal Medicine

## 2020-03-03 NOTE — Telephone Encounter (Signed)
Spoke to pt, TCS w/Dr. Marletta Lor ASA 2 scheduled for 04/18/20 at 8:00am. Covid test 04/17/20 at 8:30am. Orders entered. Appt letter mailed with procedure instructions.  PA for TCS submitted via [facility] website. PA# [id number], valid 04/18/20-07/17/20.

## 2020-03-03 NOTE — Telephone Encounter (Signed)
Pt received letter that we have been trying to reach him via phone to schedule his procedure. I told him that the scheduler would have to call him back. He said he was at work and has bad signal. He is going to try calling back later today or tomorrow.

## 2020-04-17 ENCOUNTER — Other Ambulatory Visit: Payer: Self-pay

## 2020-04-17 ENCOUNTER — Other Ambulatory Visit (HOSPITAL_COMMUNITY)
Admission: RE | Admit: 2020-04-17 | Discharge: 2020-04-17 | Disposition: A | Discharge status: Home / Self Care | Payer: 59 | Source: Ambulatory Visit | Attending: Internal Medicine | Admitting: Internal Medicine

## 2020-04-17 DIAGNOSIS — Z01812 Encounter for preprocedural laboratory examination: Secondary | ICD-10-CM | POA: Diagnosis present

## 2020-04-17 DIAGNOSIS — Z20822 Contact with and (suspected) exposure to covid-19: Secondary | ICD-10-CM | POA: Insufficient documentation

## 2020-04-17 LAB — SARS CORONAVIRUS 2 (TAT 6-24 HRS): SARS Coronavirus 2: NEGATIVE

## 2020-04-18 ENCOUNTER — Other Ambulatory Visit: Payer: Self-pay

## 2020-04-18 ENCOUNTER — Encounter (HOSPITAL_COMMUNITY)
Admission: RE | Disposition: A | Discharge status: Home / Self Care | Payer: Self-pay | Source: Home / Self Care | Attending: Internal Medicine

## 2020-04-18 ENCOUNTER — Ambulatory Visit (HOSPITAL_COMMUNITY): Payer: 59 | Admitting: Anesthesiology

## 2020-04-18 ENCOUNTER — Encounter (HOSPITAL_COMMUNITY): Payer: Self-pay

## 2020-04-18 ENCOUNTER — Ambulatory Visit (HOSPITAL_COMMUNITY)
Admission: RE | Admit: 2020-04-18 | Discharge: 2020-04-18 | Disposition: A | Discharge status: Home / Self Care | Payer: 59 | Attending: Internal Medicine | Admitting: Internal Medicine

## 2020-04-18 DIAGNOSIS — Z79899 Other long term (current) drug therapy: Secondary | ICD-10-CM | POA: Insufficient documentation

## 2020-04-18 DIAGNOSIS — K648 Other hemorrhoids: Secondary | ICD-10-CM | POA: Diagnosis not present

## 2020-04-18 DIAGNOSIS — Z87891 Personal history of nicotine dependence: Secondary | ICD-10-CM | POA: Insufficient documentation

## 2020-04-18 DIAGNOSIS — Z87892 Personal history of anaphylaxis: Secondary | ICD-10-CM | POA: Diagnosis not present

## 2020-04-18 DIAGNOSIS — Z91030 Bee allergy status: Secondary | ICD-10-CM | POA: Insufficient documentation

## 2020-04-18 DIAGNOSIS — K5730 Diverticulosis of large intestine without perforation or abscess without bleeding: Secondary | ICD-10-CM | POA: Insufficient documentation

## 2020-04-18 DIAGNOSIS — Z1211 Encounter for screening for malignant neoplasm of colon: Secondary | ICD-10-CM | POA: Diagnosis present

## 2020-04-18 HISTORY — PX: COLONOSCOPY WITH PROPOFOL: SHX5780

## 2020-04-18 SURGERY — COLONOSCOPY WITH PROPOFOL
Anesthesia: General

## 2020-04-18 MED ORDER — LIDOCAINE HCL (CARDIAC) PF 100 MG/5ML IV SOSY
PREFILLED_SYRINGE | INTRAVENOUS | Status: DC | PRN
  Administered 2020-04-18: 50 mg via INTRAVENOUS

## 2020-04-18 MED ORDER — PROPOFOL 10 MG/ML IV BOLUS
INTRAVENOUS | Status: DC | PRN
  Administered 2020-04-18: 40 mg via INTRAVENOUS
  Administered 2020-04-18: 100 mg via INTRAVENOUS
  Administered 2020-04-18: 30 mg via INTRAVENOUS
  Administered 2020-04-18: 40 mg via INTRAVENOUS
  Administered 2020-04-18: 30 mg via INTRAVENOUS

## 2020-04-18 MED ORDER — LACTATED RINGERS IV SOLN: INTRAVENOUS | Status: DC

## 2020-04-18 NOTE — Anesthesia Postprocedure Evaluation (Signed)
Anesthesia Post Note  Patient: Wesley Banks  Procedure(s) Performed: COLONOSCOPY WITH PROPOFOL (N/A )  Patient location during evaluation: Endoscopy Anesthesia Type: General Level of consciousness: awake and alert and oriented Pain management: pain level controlled Vital Signs Assessment: post-procedure vital signs reviewed and stable Respiratory status: spontaneous breathing, nonlabored ventilation and respiratory function stable Cardiovascular status: blood pressure returned to baseline and stable Postop Assessment: no apparent nausea or vomiting Anesthetic complications: no   No complications documented.   Last Vitals:  Vitals:   04/18/20 0720 04/18/20 0858  BP: 131/86 94/63  Pulse: (!) 54 (!) 56  Resp: 11 14  Temp: 36.8 C 36.4 C  SpO2: 98% 97%    Last Pain:  Vitals:   04/18/20 0858  TempSrc: Oral  PainSc: 0-No pain                 Julian Reil

## 2020-04-18 NOTE — Anesthesia Preprocedure Evaluation (Signed)
Anesthesia Evaluation  Patient identified by MRN, date of birth, ID band Patient awake    Reviewed: Allergy & Precautions, H&P , NPO status , Patient's Chart, lab work & pertinent test results, reviewed documented beta blocker date and time   Airway Mallampati: II  TM Distance: >3 FB Neck ROM: full    Dental no notable dental hx.    Pulmonary neg pulmonary ROS, former smoker,    Pulmonary exam normal breath sounds clear to auscultation       Cardiovascular Exercise Tolerance: Good negative cardio ROS   Rhythm:regular Rate:Normal     Neuro/Psych negative neurological ROS  negative psych ROS   GI/Hepatic negative GI ROS, Neg liver ROS,   Endo/Other  negative endocrine ROS  Renal/GU negative Renal ROS  negative genitourinary   Musculoskeletal   Abdominal   Peds  Hematology negative hematology ROS (+)   Anesthesia Other Findings   Reproductive/Obstetrics negative OB ROS                             Anesthesia Physical Anesthesia Plan  ASA: I  Anesthesia Plan: General   Post-op Pain Management:    Induction:   PONV Risk Score and Plan: Propofol infusion and TIVA  Airway Management Planned:   Additional Equipment:   Intra-op Plan:   Post-operative Plan:   Informed Consent: I have reviewed the patients History and Physical, chart, labs and discussed the procedure including the risks, benefits and alternatives for the proposed anesthesia with the patient or authorized representative who has indicated his/her understanding and acceptance.     Dental Advisory Given  Plan Discussed with: CRNA  Anesthesia Plan Comments:         Anesthesia Quick Evaluation

## 2020-04-18 NOTE — H&P (Signed)
Primary Care Physician:  John Giovanni, MD Primary Gastroenterologist:  Dr. Marletta Lor  Pre-Procedure History & Physical: HPI:  Wesley Banks is a 50 y.o. male is here for a colonoscopy for colon cancer screening purposes.  Patient denies any family history of colorectal cancer.  No melena or hematochezia.  No abdominal pain or unintentional weight loss.  No change in bowel habits.  Overall feels well from a GI standpoint.  Past Medical History:  Diagnosis Date  . Back pain   . Herpetic dermatitis   . Osteoarthritis     Past Surgical History:  Procedure Laterality Date  . TONSILLECTOMY      Prior to Admission medications   Medication Sig Start Date End Date Taking? Authorizing Provider  amphetamine-dextroamphetamine (ADDERALL) 10 MG tablet Take 20-30 mg by mouth daily with breakfast.    Yes [provider]  naproxen sodium (ALEVE) 220 MG tablet Take 440 mg by mouth 2 (two) times daily as needed (pain).   Yes [provider]    Allergies as of 03/03/2020 - Review Complete 02/27/2020  Allergen Reaction Noted  . Bee venom Anaphylaxis 08/06/2017    Family History  Problem Relation Age of Onset  . Colon cancer Neg Hx   . Gastric cancer Neg Hx   . Esophageal cancer Neg Hx     Social History   Socioeconomic History  . Marital status: Married    Spouse name: Not on file  . Number of children: Not on file  . Years of education: Not on file  . Highest education level: Not on file  Occupational History  . Not on file  Tobacco Use  . Smoking status: Former Smoker    Packs/day: 1.00    Types: Cigarettes  . Smokeless tobacco: Never Used  Vaping Use  . Vaping Use: Never used  Substance and Sexual Activity  . Alcohol use: Yes    Comment: 12 beers per week  . Drug use: No  . Sexual activity: Yes    Birth control/protection: None  Other Topics Concern  . Not on file  Social History Narrative  . Not on file   Social Determinants of Health    Financial Resource Strain: Not on file  Food Insecurity: Not on file  Transportation Needs: Not on file  Physical Activity: Not on file  Stress: Not on file  Social Connections: Not on file  Intimate Partner Violence: Not on file    Review of Systems: See HPI, otherwise negative ROS  Impression/Plan: Wesley Banks is here for a colonoscopy to be performed for colon cancer screening purposes.  The risks of the procedure including infection, bleed, or perforation as well as benefits, limitations, alternatives and imponderables have been reviewed with the patient. Questions have been answered. All parties agreeable.

## 2020-04-18 NOTE — Transfer of Care (Signed)
Immediate Anesthesia Transfer of Care Note  Patient: Helmuth Recupero Lacson  Procedure(s) Performed: COLONOSCOPY WITH PROPOFOL (N/A )  Patient Location: Endoscopy Unit  Anesthesia Type:General  Level of Consciousness: awake, alert  and oriented  Airway & Oxygen Therapy: Patient Spontanous Breathing  Post-op Assessment: Report given to RN and Post -op Vital signs reviewed and stable  Post vital signs: Reviewed and stable  Last Vitals:  Vitals Value Taken Time  BP    Temp 36.4 C 04/18/20 0858  Pulse 56 04/18/20 0858  Resp 14 04/18/20 0858  SpO2 97 % 04/18/20 0858    Last Pain:  Vitals:   04/18/20 0858  TempSrc: Oral  PainSc:       Patients Stated Pain Goal: 0 (04/18/20 0720)  Complications: No complications documented.

## 2020-04-18 NOTE — Anesthesia Procedure Notes (Signed)
Date/Time: 04/18/2020 8:41 AM Performed by: Julian Reil, CRNA Pre-anesthesia Checklist: Patient identified, Emergency Drugs available, Suction available and Patient being monitored Patient Re-evaluated:Patient Re-evaluated prior to induction Oxygen Delivery Method: Nasal cannula Induction Type: IV induction Placement Confirmation: positive ETCO2

## 2020-04-18 NOTE — Discharge Instructions (Signed)
  Colonoscopy Discharge Instructions  Read the instructions outlined below and refer to this sheet in the next few weeks. These discharge instructions provide you with general information on caring for yourself after you leave the hospital. Your doctor may also give you specific instructions. While your treatment has been planned according to the most current medical practices available, unavoidable complications occasionally occur.   ACTIVITY  You may resume your regular activity, but move at a slower pace for the next 24 hours.   Take frequent rest periods for the next 24 hours.   Walking will help get rid of the air and reduce the bloated feeling in your belly (abdomen).   No driving for 24 hours (because of the medicine (anesthesia) used during the test).    Do not sign any important legal documents or operate any machinery for 24 hours (because of the anesthesia used during the test).  NUTRITION  Drink plenty of fluids.   You may resume your normal diet as instructed by your doctor.   Begin with a light meal and progress to your normal diet. Heavy or fried foods are harder to digest and may make you feel sick to your stomach (nauseated).   Avoid alcoholic beverages for 24 hours or as instructed.  MEDICATIONS  You may resume your normal medications unless your doctor tells you otherwise.  WHAT YOU CAN EXPECT TODAY  Some feelings of bloating in the abdomen.   Passage of more gas than usual.   Spotting of blood in your stool or on the toilet paper.  IF YOU HAD POLYPS REMOVED DURING THE COLONOSCOPY:  No aspirin products for 7 days or as instructed.   No alcohol for 7 days or as instructed.   Eat a soft diet for the next 24 hours.  FINDING OUT THE RESULTS OF YOUR TEST Not all test results are available during your visit. If your test results are not back during the visit, make an appointment with your caregiver to find out the results. Do not assume everything is normal if  you have not heard from your caregiver or the medical facility. It is important for you to follow up on all of your test results.  SEEK IMMEDIATE MEDICAL ATTENTION IF:  You have more than a spotting of blood in your stool.   Your belly is swollen (abdominal distention).   You are nauseated or vomiting.   You have a temperature over 101.   You have abdominal pain or discomfort that is severe or gets worse throughout the day.   Your colonoscopy was relatively unremarkable. I did not find any polyps or evidence of colon cancer. Repeat in 10 years. You do have diverticulosis and internal hemorrhoids. I would recommend increasing fiber in your diet or adding OTC Benefiber/Metamucil. Be sure to drink at least 4 to 6 glasses of water daily. Follow-up with GI as needed.   I hope you have a great rest of your week!  Hennie Duos. Marletta Lor, D.O. Gastroenterology and Hepatology [facility]

## 2020-04-18 NOTE — Op Note (Signed)
[facility] Patient Name: Wesley Banks Procedure Date: 04/18/2020 8:22 AM MRN: [id number] Date of Birth: [birth year] Attending MD: Elon Alas. Abbey Chatters DO CSN: [id number] Age: 50 Admit Type: Outpatient Procedure:                Colonoscopy Indications:              Screening for colorectal malignant neoplasm Providers:                Elon Alas. Abbey Chatters, DO, Gwenlyn Fudge, RN, Aram Candela Referring MD:              Medicines:                See the Anesthesia note for documentation of the                            administered medications Complications:            No immediate complications. Estimated Blood Loss:     Estimated blood loss: none. Procedure:                Pre-Anesthesia Assessment:                           - The anesthesia plan was to use monitored                            anesthesia care (MAC).                           After obtaining informed consent, the colonoscope                            was passed under direct vision. Throughout the                            procedure, the patient's blood pressure, pulse, and                            oxygen saturations were monitored continuously. The                            [id number]-HQ190L ([id number]) scope was introduced through                            the anus and advanced to the the cecum, identified                            by appendiceal orifice and ileocecal valve. The                            colonoscopy was performed without difficulty. The                            patient tolerated the procedure well. The quality  of the bowel preparation was evaluated using the                            BBPS [facility] Bowel Preparation Scale) with scores                            of: Right Colon = 2 (minor amount of residual                            staining, small fragments of stool and/or opaque                            liquid, but mucosa seen well),  Transverse Colon = 2                            (minor amount of residual staining, small fragments                            of stool and/or opaque liquid, but mucosa seen                            well) and Left Colon = 2 (minor amount of residual                            staining, small fragments of stool and/or opaque                            liquid, but mucosa seen well). The total BBPS score                            equals 6. The quality of the bowel preparation was                            fair. Scope In: 2:97:98 AM Scope Out: 8:56:33 AM Total Procedure Duration: 0 hours 9 minutes 46 seconds  Findings:      The perianal and digital rectal examinations were normal.      Non-bleeding internal hemorrhoids were found during endoscopy.      A few small-mouthed diverticula were found in the sigmoid colon.      The exam was otherwise without abnormality. Impression:               - Preparation of the colon was fair.                           - Non-bleeding internal hemorrhoids.                           - Diverticulosis in the sigmoid colon.                           - The examination was otherwise normal.                           -  No specimens collected. Moderate Sedation:      Per Anesthesia Care Recommendation:           - Patient has a contact number available for                            emergencies. The signs and symptoms of potential                            delayed complications were discussed with the                            patient. Return to normal activities tomorrow.                            Written discharge instructions were provided to the                            patient.                           - Continue present medications.                           - Resume previous diet.                           - Repeat colonoscopy in 10 years for screening                            purposes.                           - Return to GI clinic  PRN. Procedure Code(s):        --- Professional ---                           Z6109, Colorectal cancer screening; colonoscopy on                            individual not meeting criteria for high risk Diagnosis Code(s):        --- Professional ---                           Z12.11, Encounter for screening for malignant                            neoplasm of colon                           K64.8, Other hemorrhoids                           K57.30, Diverticulosis of large intestine without                            perforation or abscess without bleeding CPT copyright 2019 American Medical Association.  All rights reserved. The codes documented in this report are preliminary and upon coder review may  be revised to meet current compliance requirements. Elon Alas. Abbey Chatters, DO [place] Abbey Chatters, DO 04/18/2020 9:00:52 AM This report has been signed electronically. Number of Addenda: 0

## 2020-04-22 ENCOUNTER — Encounter (HOSPITAL_COMMUNITY): Payer: Self-pay | Admitting: Internal Medicine

## 2020-10-13 ENCOUNTER — Other Ambulatory Visit (HOSPITAL_COMMUNITY): Payer: Self-pay | Admitting: Family Medicine

## 2020-10-13 ENCOUNTER — Ambulatory Visit (HOSPITAL_COMMUNITY)
Admission: RE | Admit: 2020-10-13 | Discharge: 2020-10-13 | Disposition: A | Discharge status: Home / Self Care | Payer: 59 | Source: Ambulatory Visit | Attending: Family Medicine | Admitting: Family Medicine

## 2020-10-13 ENCOUNTER — Other Ambulatory Visit: Payer: Self-pay

## 2020-10-13 DIAGNOSIS — M79605 Pain in left leg: Secondary | ICD-10-CM | POA: Insufficient documentation

## 2020-11-29 IMAGING — CT CT ANGIOGRAPHY CHEST
2 of 6 series · 18 of 46 positions shown · IV contrast (Isovue)
Comparison: Chest radiograph September 13, 2018

CLINICAL DATA: Chest pain

EXAM:
CT ANGIOGRAPHY CHEST WITH CONTRAST
TECHNIQUE: Multidetector CT imaging of the chest was performed using the
standard protocol during bolus administration of intravenous
contrast. Multiplanar CT image reconstructions and MIPs were
obtained to evaluate the vascular anatomy.
CONTRAST:  100mL OMNIPAQUE IOHEXOL 350 MG/ML SOLN

[Series 5: thins · axial · 0.71mm/px · z∈[-88,+182]mm · 15 of 296 slices shown]
[im 13/296  lung]
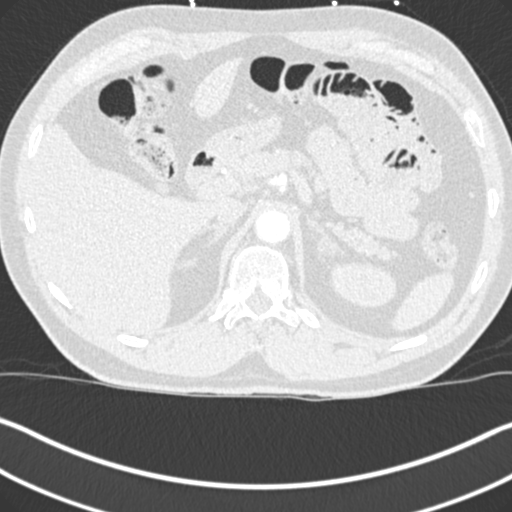
[im 39/296  soft-tissue]
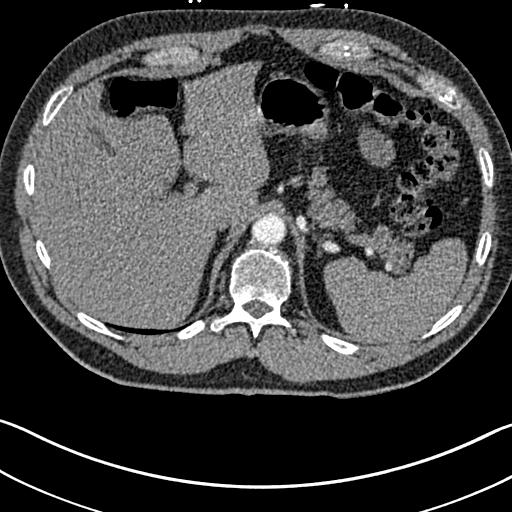
[im 52/296  lung]
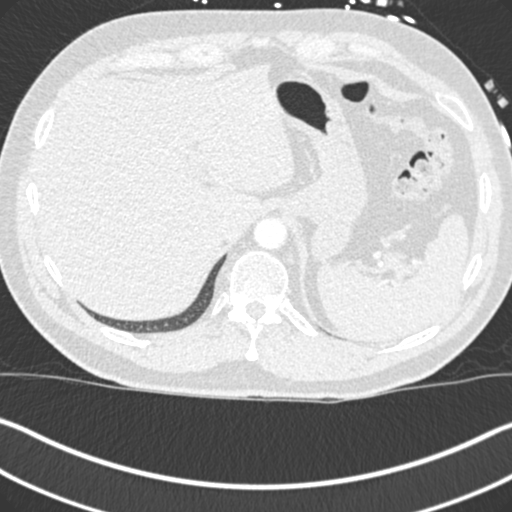
[im 77/296  soft-tissue]
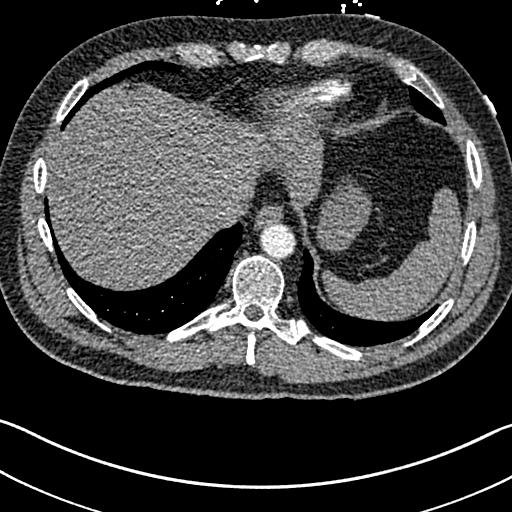
[im 90/296  lung]
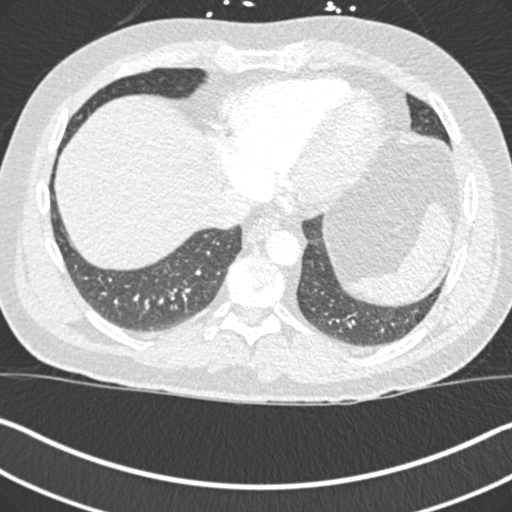
[im 116/296  soft-tissue]
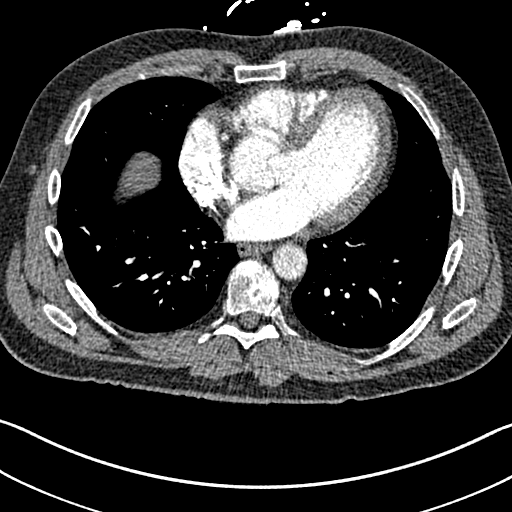
[im 129/296  lung]
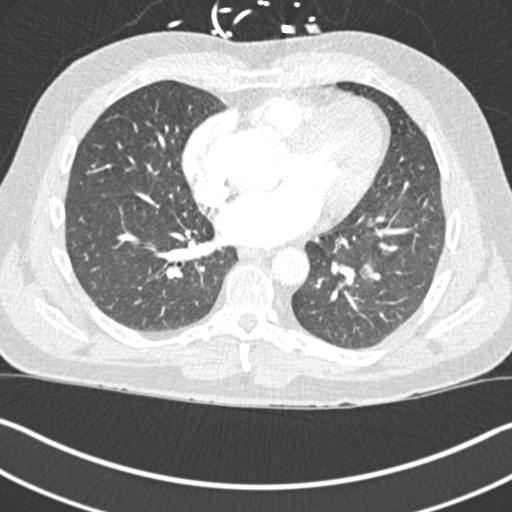
[im 154/296  soft-tissue]
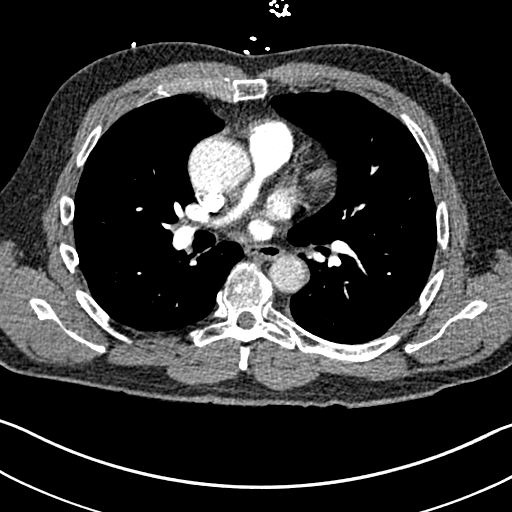
[im 167/296  lung]
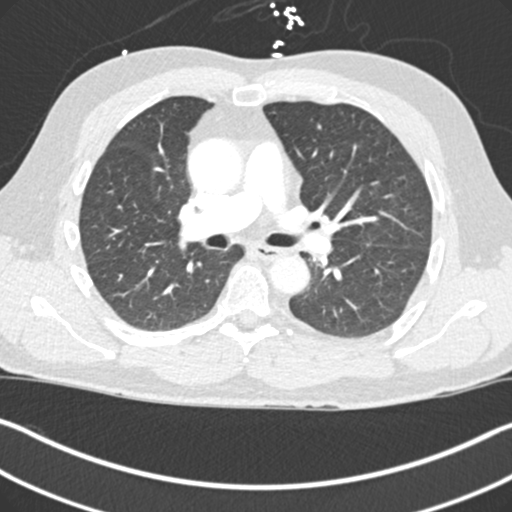
[im 180/296  soft-tissue]
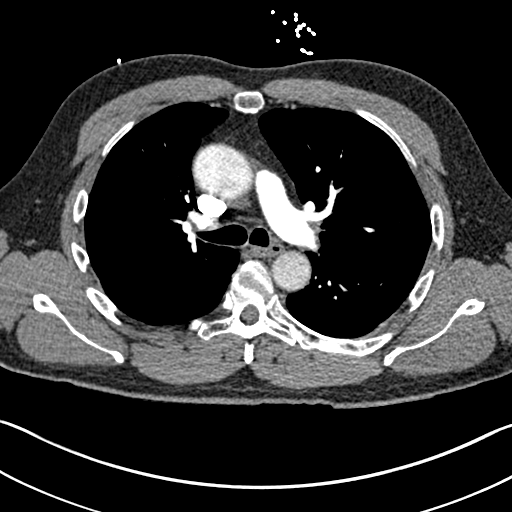
[im 206/296  lung]
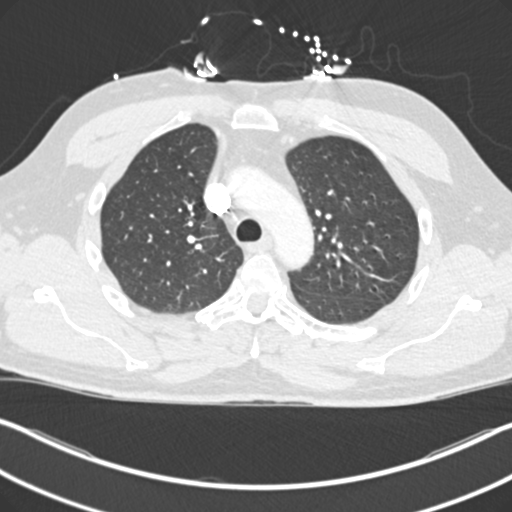
[im 219/296  soft-tissue]
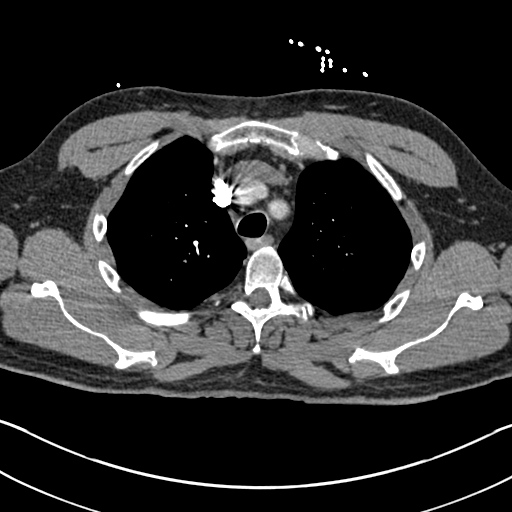
[im 244/296  lung]
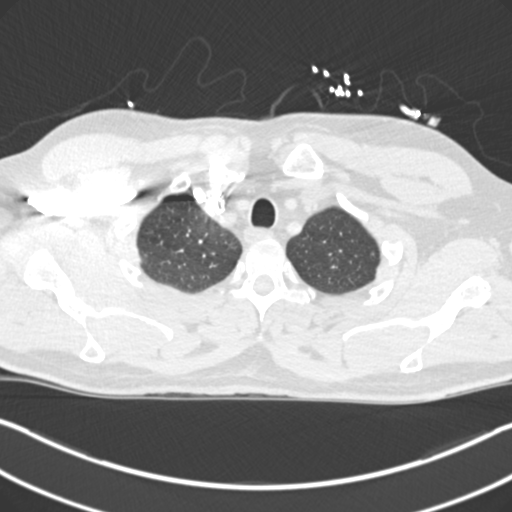
[im 257/296  soft-tissue]
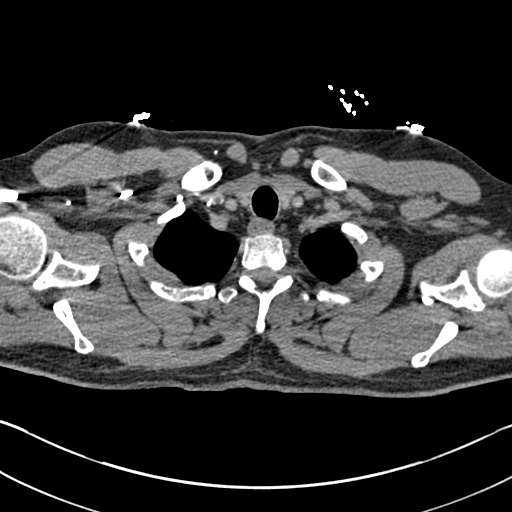
[im 283/296  lung]
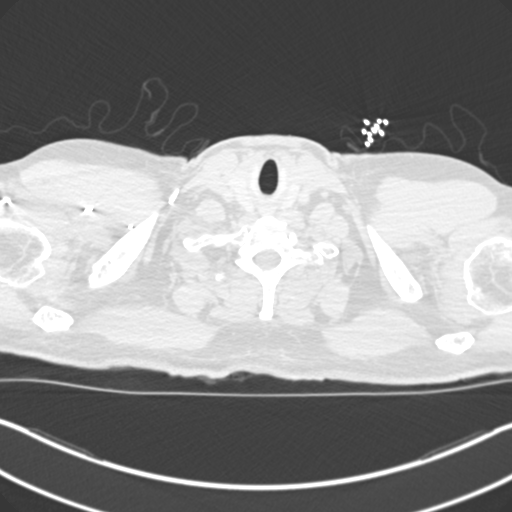

[Series 7: coronal mpr · coronal · 0.63mm/px · 3 of 146 slices shown]
[im 37/146  soft-tissue]
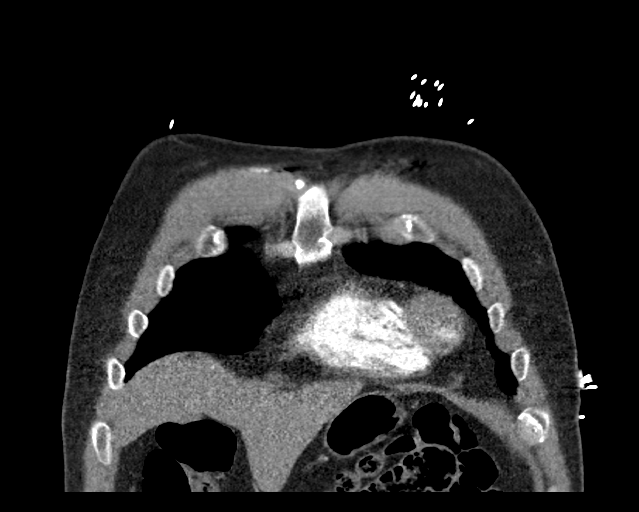
[im 73/146  soft-tissue]
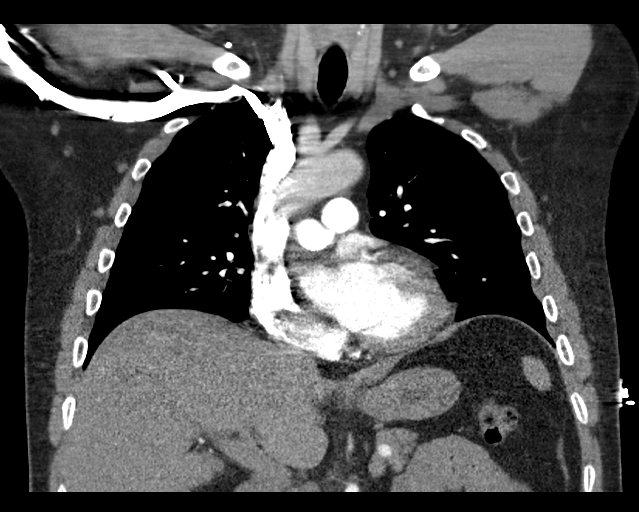
[im 109/146  soft-tissue]
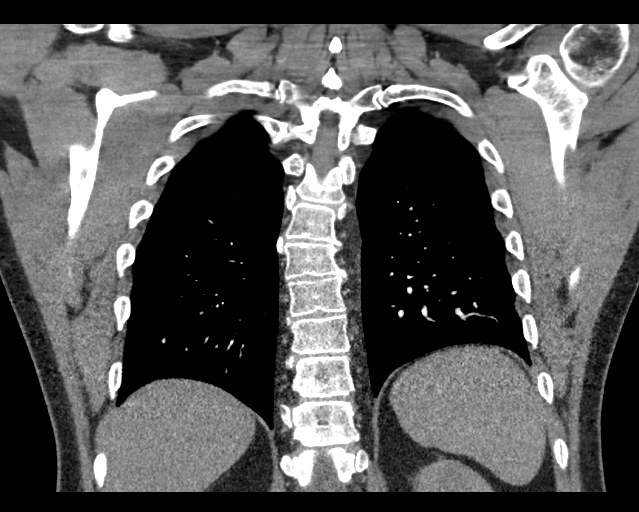

[18 of 46 positions shown; findings below may reference images not displayed]

FINDINGS: Cardiovascular: There is no demonstrable pulmonary embolus. The
ascending thoracic aorta has a measured transverse diameter of 4.1 x
4.1 cm. There is no evident thoracic aortic dissection. The
visualized great vessels appear normal. Note that the right
innominate and left common carotid arteries arise as a common trunk,
an anatomic variant. There is no pericardial effusion or pericardial
thickening. There are foci of coronary artery calcification.

Mediastinum/Nodes: Thyroid appears unremarkable. There is no
appreciable thoracic adenopathy. Esophagus appears normal.

Lungs/Pleura: There is no edema or consolidation. There is minimal
atelectasis in the left lower lobe. There is no pleural effusion or
pleural thickening evident.

Upper Abdomen: There is an approximately 1 x 1 cm left adrenal
adenoma. Visualized upper abdominal structures otherwise appear
unremarkable.

Musculoskeletal: There is midthoracic dextroscoliosis. There are no
blastic or lytic bone lesions. There are no evident chest wall
lesions.

Review of the MIP images confirms the above findings.
IMPRESSION: 1. There is no demonstrable pulmonary embolus. There is no thoracic
aortic aneurysm or dissection. There are foci of coronary artery
calcification.

2. Minimal left base atelectasis. No edema or consolidation. No
pleural effusion.

3.  No evident thoracic adenopathy.

4.  Approximately 1 cm benign left adrenal adenoma.

## 2022-12-30 IMAGING — US US EXTREM LOW VENOUS*L*
1 series · 14 of 24 positions shown · non-contrast
Comparison: None.

CLINICAL DATA: Rule out DVT. Left lower extremity pain for 6 weeks.
Recently worse.

EXAM:
LEFT LOWER EXTREMITY VENOUS DOPPLER ULTRASOUND
TECHNIQUE: Gray-scale sonography with compression, as well as color and duplex
ultrasound, were performed to evaluate the deep venous system(s)
from the level of the common femoral vein through the popliteal and
proximal calf veins.

[Series 1: us extrem low venous*left* · 0.08mm/px · 14 of 36 slices shown]
[im 1/36]
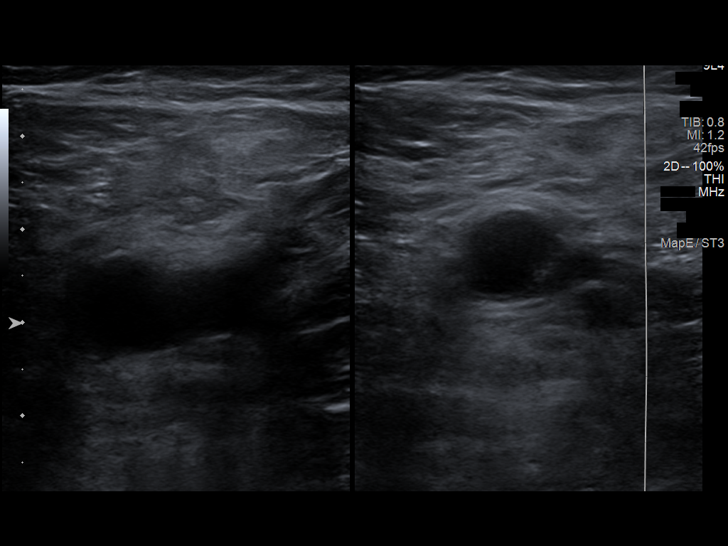
[im 4/36]
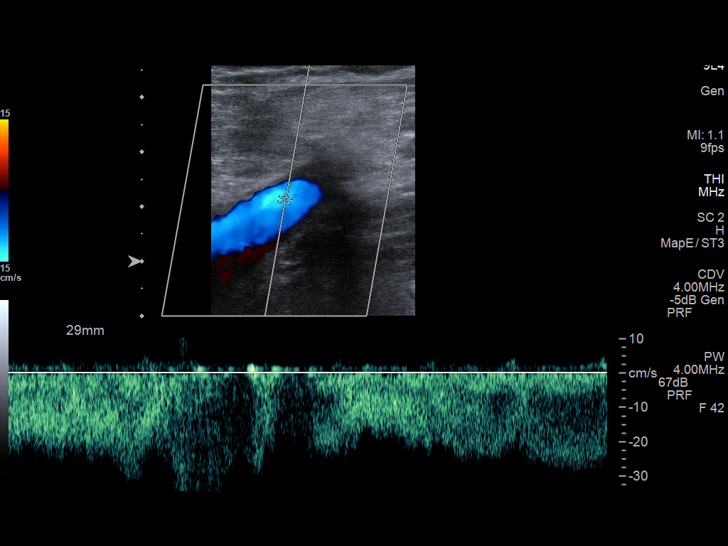
[im 7/36]
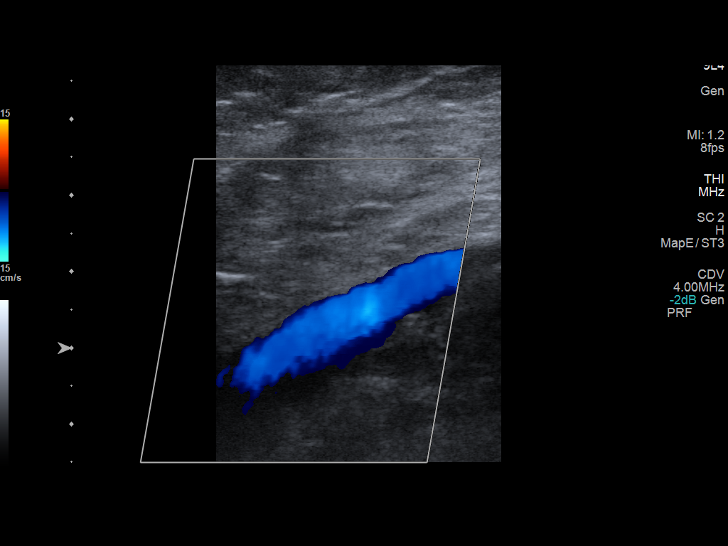
[im 10/36]
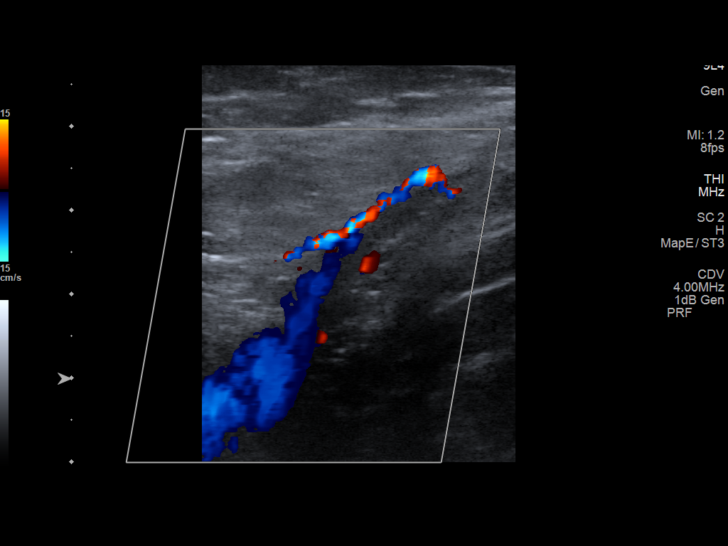
[im 11/36]
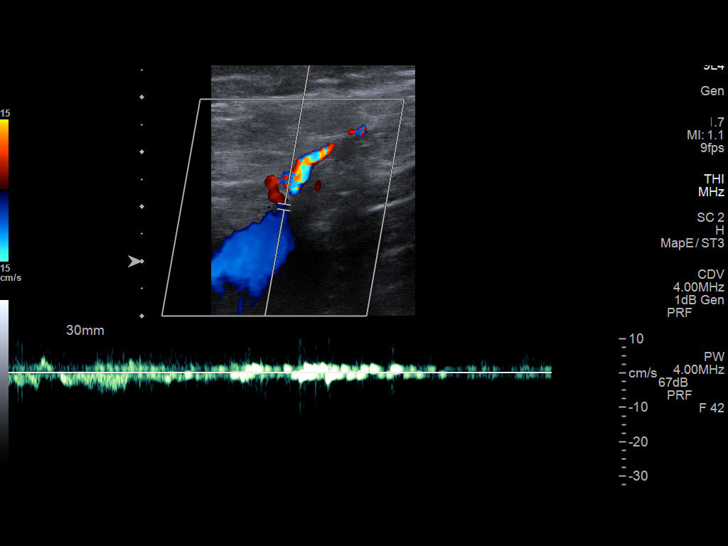
[im 14/36]
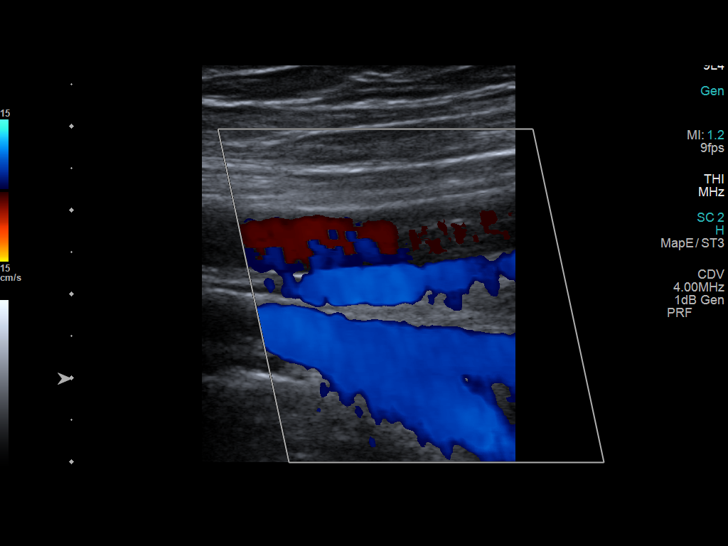
[im 17/36]
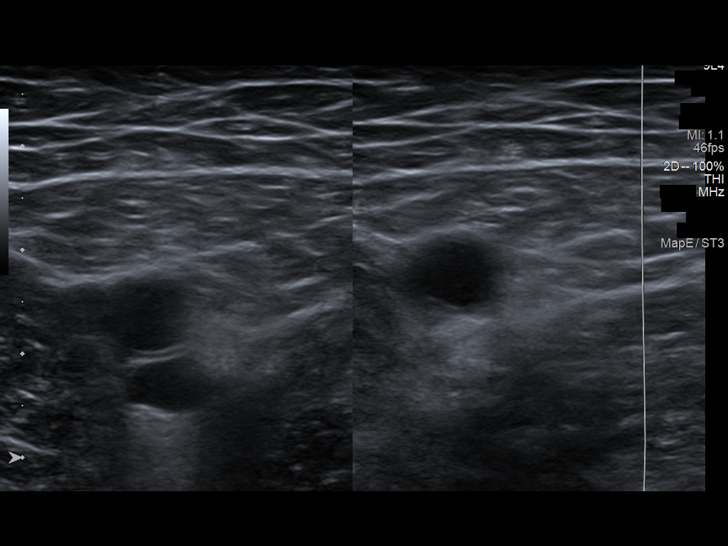
[im 19/36]
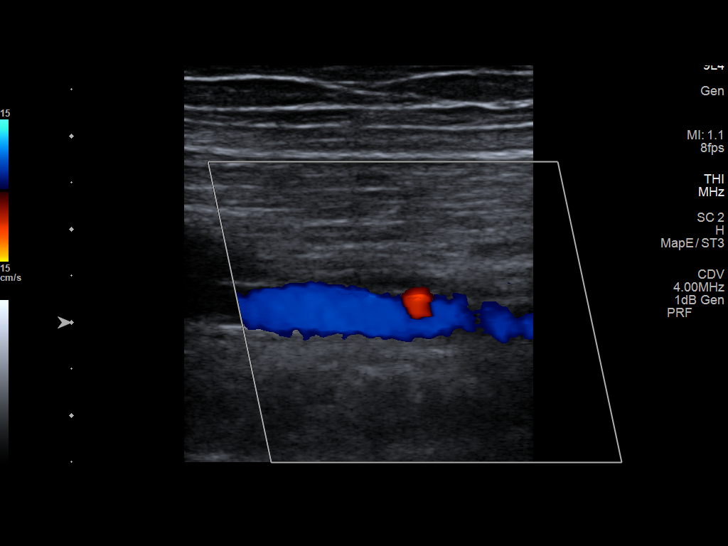
[im 22/36]
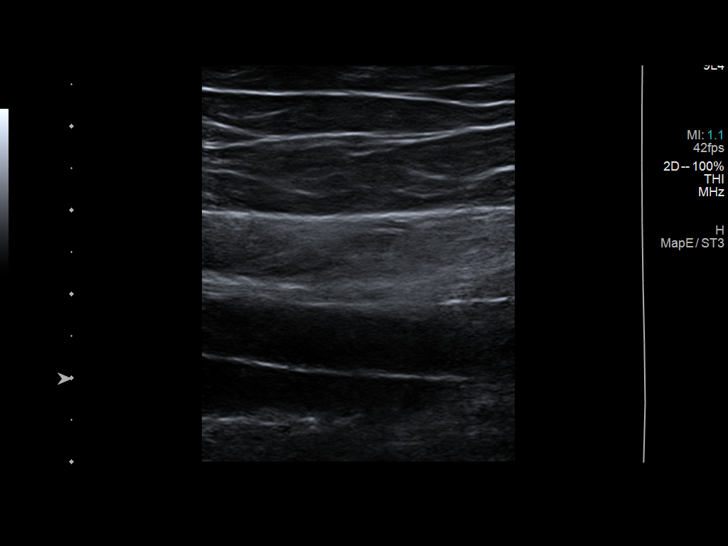
[im 25/36]
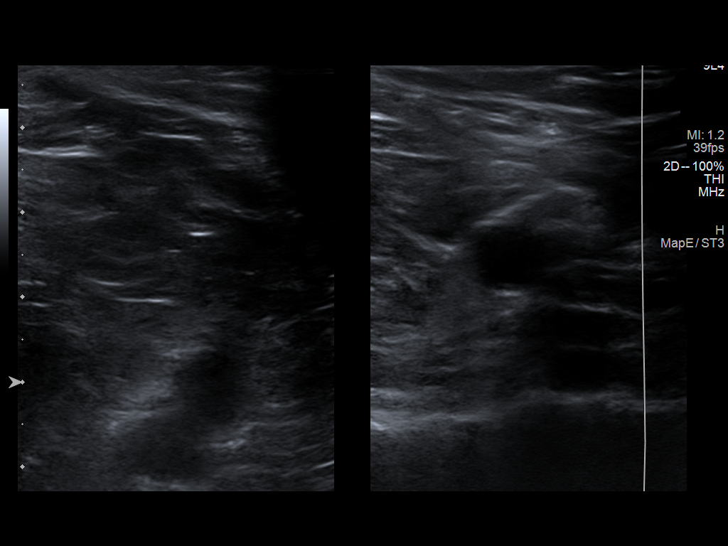
[im 28/36]
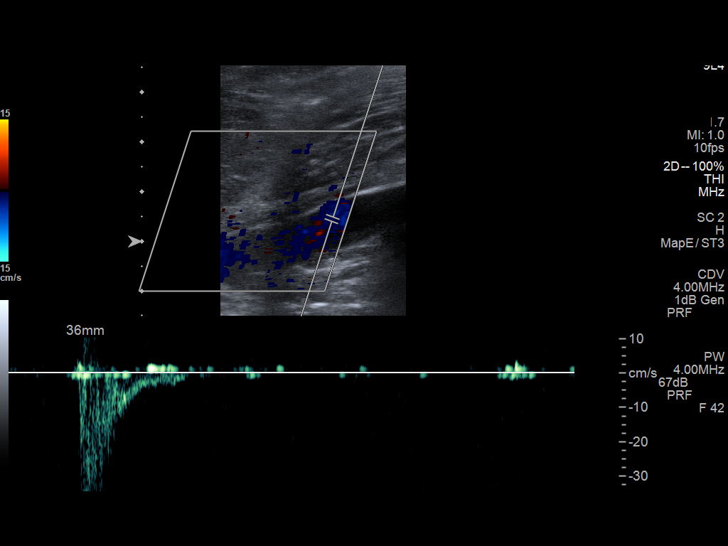
[im 29/36]
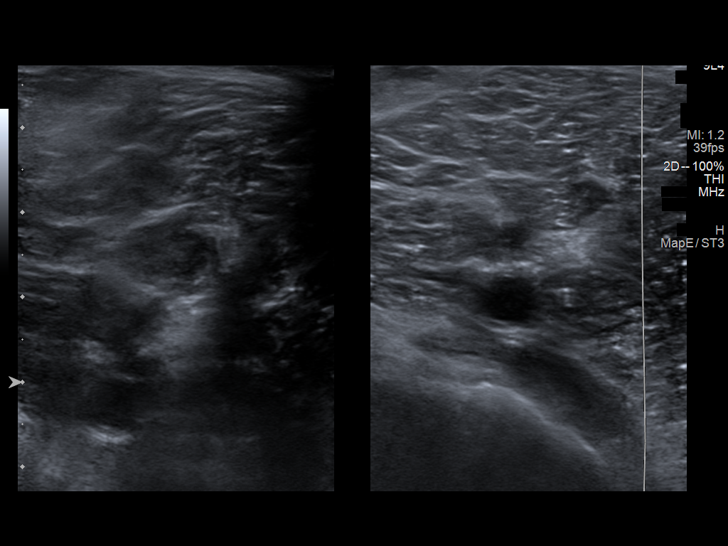
[im 32/36]
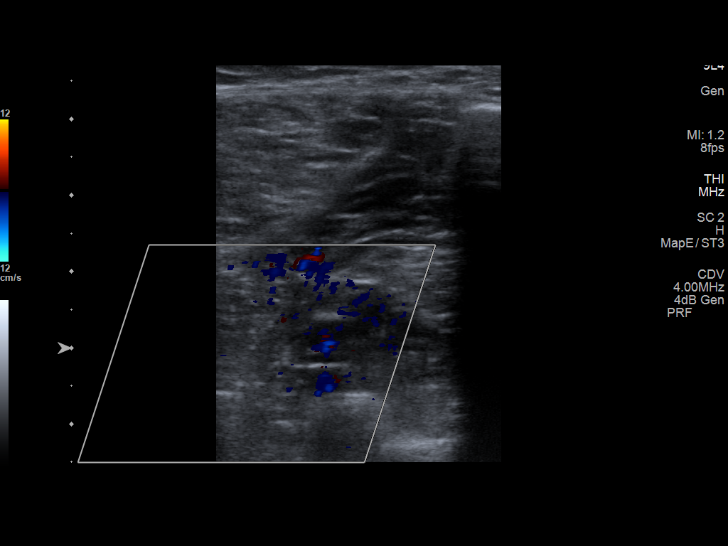
[im 36/36]
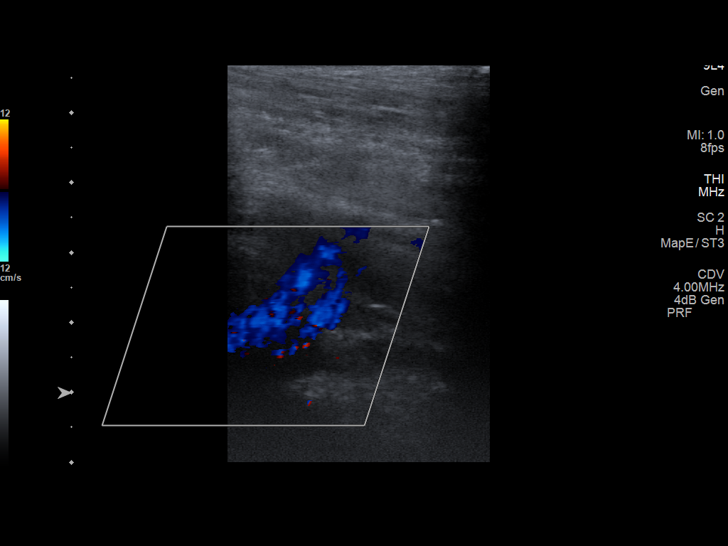

[14 of 24 positions shown; findings below may reference images not displayed]

FINDINGS: VENOUS

Normal compressibility of the common femoral, superficial femoral,
and popliteal veins, as well as the visualized calf veins.
Visualized portions of profunda femoral vein and great saphenous
vein unremarkable. No filling defects to suggest DVT on grayscale or
color Doppler imaging. Doppler waveforms show normal direction of
venous flow, normal respiratory plasticity and response to
augmentation.

Limited views of the contralateral common femoral vein are
unremarkable.
IMPRESSION: No evidence of DVT in left lower extremity.

## 2023-09-01 ENCOUNTER — Other Ambulatory Visit (HOSPITAL_COMMUNITY): Payer: Self-pay | Admitting: Nurse Practitioner

## 2023-09-01 DIAGNOSIS — Z87442 Personal history of urinary calculi: Secondary | ICD-10-CM

## 2024-03-13 ENCOUNTER — Emergency Department (HOSPITAL_COMMUNITY)
Admission: EM | Admit: 2024-03-13 | Discharge: 2024-03-13 | Disposition: A | Discharge status: Home / Self Care | Attending: Emergency Medicine | Admitting: Emergency Medicine

## 2024-03-13 ENCOUNTER — Encounter (HOSPITAL_COMMUNITY): Payer: Self-pay

## 2024-03-13 ENCOUNTER — Emergency Department (HOSPITAL_COMMUNITY)

## 2024-03-13 ENCOUNTER — Other Ambulatory Visit: Payer: Self-pay

## 2024-03-13 DIAGNOSIS — R29810 Facial weakness: Secondary | ICD-10-CM | POA: Diagnosis present

## 2024-03-13 DIAGNOSIS — G519 Disorder of facial nerve, unspecified: Secondary | ICD-10-CM | POA: Insufficient documentation

## 2024-03-13 DIAGNOSIS — G510 Bell's palsy: Secondary | ICD-10-CM | POA: Diagnosis not present

## 2024-03-13 LAB — CBC WITH DIFFERENTIAL/PLATELET
Abs Immature Granulocytes: 0.04 K/uL (ref 0.00–0.07)
Basophils Absolute: 0.1 K/uL (ref 0.0–0.1)
Basophils Relative: 1 %
Eosinophils Absolute: 0.2 K/uL (ref 0.0–0.5)
Eosinophils Relative: 3 %
HCT: 43.2 % (ref 39.0–52.0)
Hemoglobin: 15 g/dL (ref 13.0–17.0)
Immature Granulocytes: 0 %
Lymphocytes Relative: 29 %
Lymphs Abs: 2.7 K/uL (ref 0.7–4.0)
MCH: 31.1 pg (ref 26.0–34.0)
MCHC: 34.7 g/dL (ref 30.0–36.0)
MCV: 89.6 fL (ref 80.0–100.0)
Monocytes Absolute: 0.7 K/uL (ref 0.1–1.0)
Monocytes Relative: 8 %
Neutro Abs: 5.8 K/uL (ref 1.7–7.7)
Neutrophils Relative %: 59 %
Platelets: 359 K/uL (ref 150–400)
RBC: 4.82 MIL/uL (ref 4.22–5.81)
RDW: 12.4 % (ref 11.5–15.5)
WBC: 9.6 K/uL (ref 4.0–10.5)
nRBC: 0 % (ref 0.0–0.2)

## 2024-03-13 LAB — CBG MONITORING, ED: Glucose-Capillary: 122 mg/dL — ABNORMAL HIGH (ref 70–99)

## 2024-03-13 LAB — COMPREHENSIVE METABOLIC PANEL WITH GFR
ALT: 28 U/L (ref 0–44)
AST: 26 U/L (ref 15–41)
Albumin: 4.7 g/dL (ref 3.5–5.0)
Alkaline Phosphatase: 91 U/L (ref 38–126)
Anion gap: 13 (ref 5–15)
BUN: 17 mg/dL (ref 6–20)
CO2: 25 mmol/L (ref 22–32)
Calcium: 9.1 mg/dL (ref 8.9–10.3)
Chloride: 106 mmol/L (ref 98–111)
Creatinine, Ser: 1.04 mg/dL (ref 0.61–1.24)
GFR, Estimated: >60 mL/min
Glucose, Bld: 87 mg/dL (ref 70–99)
Potassium: 4.2 mmol/L (ref 3.5–5.1)
Sodium: 144 mmol/L (ref 135–145)
Total Bilirubin: 0.3 mg/dL (ref 0.0–1.2)
Total Protein: 7.2 g/dL (ref 6.5–8.1)

## 2024-03-13 MED ORDER — ACYCLOVIR 400 MG PO TABS: 400.0000 mg | ORAL_TABLET | Freq: Every day | ORAL | 0 refills | Status: AC

## 2024-03-13 MED ORDER — ACYCLOVIR 800 MG PO TABS
800.0000 mg | ORAL_TABLET | Freq: Once | ORAL | Status: AC
  Administered 2024-03-13: 800 mg via ORAL
  Filled 2024-03-13: qty 1

## 2024-03-13 MED ORDER — PREDNISONE 20 MG PO TABS: 60.0000 mg | ORAL_TABLET | Freq: Every day | ORAL | 0 refills | Status: DC

## 2024-03-13 MED ORDER — SYSTANE BALANCE 0.6 % OP SOLN: 1.0000 [drp] | OPHTHALMIC | 0 refills | Status: DC | PRN

## 2024-03-13 MED ORDER — SYSTANE BALANCE 0.6 % OP SOLN: 1.0000 [drp] | OPHTHALMIC | 0 refills | Status: AC | PRN

## 2024-03-13 MED ORDER — DEXAMETHASONE 4 MG PO TABS
12.0000 mg | ORAL_TABLET | Freq: Once | ORAL | Status: AC
  Administered 2024-03-13: 12 mg via ORAL
  Filled 2024-03-13: qty 3

## 2024-03-13 MED ORDER — PREDNISONE 20 MG PO TABS: 60.0000 mg | ORAL_TABLET | Freq: Every day | ORAL | 0 refills | Status: AC

## 2024-03-13 MED ORDER — ACYCLOVIR 400 MG PO TABS: 400.0000 mg | ORAL_TABLET | Freq: Every day | ORAL | 0 refills | Status: DC

## 2024-03-13 NOTE — ED Notes (Signed)
 ED Provider at bedside.

## 2024-03-13 NOTE — ED Provider Notes (Signed)
 " [place] EMERGENCY DEPARTMENT AT [facility] Provider Note  CSN: [id number] Arrival date & time: 03/13/24 1444  Chief Complaint(s) Facial Droop  HPI Wesley Banks is a 53 y.o. male with past medical history as below, significant for back pain, herpetic dermatitis, osteoarthritis who presents to the ED with complaint of facial droop  Reports yesterday evening he noticed left side of his face was drooping, weak.  Having difficulty smiling and closing his left eyelid.  He was drinking a beverage and began leaking out the left side of his mouth.  No fevers, no rashes, no eye pain or ear pain.  No recent falls or head injuries.  No vision changes.  No chest pain abdominal pain nausea or vomiting.  No recent travel or sick contacts.  No recent insect bites.  No recent camping or hiking trips.  Denies similar symptoms in the past. No HA, no hearing/vision changes.   Past Medical History Past Medical History:  Diagnosis Date   Back pain    Herpetic dermatitis    Osteoarthritis    Patient Active Problem List   Diagnosis Date Noted   Preventative health care 02/27/2020   Home Medication(s) Prior to Admission medications  Medication Sig Start Date End Date Taking? Authorizing Provider  acyclovir  (ZOVIRAX ) 400 MG tablet Take 1 tablet (400 mg total) by mouth 5 (five) times daily for 7 days. 03/14/24 03/21/24  Elnor Jayson LABOR, DO  amphetamine-dextroamphetamine (ADDERALL) 10 MG tablet Take 20-30 mg by mouth daily with breakfast.     [provider]  naproxen  sodium (ALEVE ) 220 MG tablet Take 440 mg by mouth 2 (two) times daily as needed (pain).    [provider]  predniSONE  (DELTASONE ) 20 MG tablet Take 3 tablets (60 mg total) by mouth daily for 7 days. 03/14/24 03/21/24  Elnor Jayson LABOR, DO  Propylene Glycol (SYSTANE BALANCE) 0.6 % SOLN Apply 1-2 drops to eye every 2 (two) hours as needed (dryness). 03/13/24   Elnor Jayson LABOR, DO                                                                                                                                     Past Surgical History Past Surgical History:  Procedure Laterality Date   COLONOSCOPY WITH PROPOFOL  N/A 04/18/2020   Procedure: COLONOSCOPY WITH PROPOFOL ;  Surgeon: Cindie Carlin POUR, DO;  Location: [facility];  Service: Endoscopy;  Laterality: N/A;  8:00am   TONSILLECTOMY     Family History Family History  Problem Relation Age of Onset   Colon cancer Neg Hx    Gastric cancer Neg Hx    Esophageal cancer Neg Hx     Social History Social History[1] Allergies Bee venom  Review of Systems A thorough review of systems was obtained and all systems are negative except as noted in the HPI and PMH.   Physical Exam Vital Signs  I have  reviewed the triage vital signs BP (!) 178/102 (BP Location: Right Arm)   Pulse 94   Temp 98.5 F (36.9 C) (Oral)   Resp 18   Ht 5' 10 (1.778 m)   Wt 98 kg   SpO2 97%   BMI 30.99 kg/m  Physical Exam Vitals and nursing note reviewed.  Constitutional:      General: He is not in acute distress.    Appearance: Normal appearance. He is well-developed. He is not ill-appearing.  HENT:     Head: Normocephalic and atraumatic.     Right Ear: Tympanic membrane and external ear normal.     Left Ear: Tympanic membrane and external ear normal.     Nose: Nose normal.     Mouth/Throat:     Mouth: Mucous membranes are moist.  Eyes:     General: No scleral icterus.       Right eye: No discharge.        Left eye: No discharge.     Extraocular Movements: Extraocular movements intact.     Pupils: Pupils are equal, round, and reactive to light.  Cardiovascular:     Rate and Rhythm: Normal rate.  Pulmonary:     Effort: Pulmonary effort is normal. No respiratory distress.     Breath sounds: No stridor.  Abdominal:     General: Abdomen is flat. There is no distension.     Tenderness: There is no guarding.  Musculoskeletal:        General: No deformity.      Cervical back: No rigidity.  Skin:    General: Skin is warm and dry.     Coloration: Skin is not cyanotic, jaundiced or pale.  Neurological:     Mental Status: He is alert and oriented to person, place, and time.     GCS: GCS eye subscore is 4. GCS verbal subscore is 5. GCS motor subscore is 6.     Cranial Nerves: Facial asymmetry present. No dysarthria.     Sensory: Sensation is intact.     Motor: Motor function is intact. No weakness or pronator drift.     Coordination: Coordination is intact. Finger-Nose-Finger Test and Heel to Pinon Test normal.     Gait: Gait is intact. Gait normal.     Comments: Left-sided facial nerve paralysis No weakness/sensation change to extremities  Strength 5/5 to BLUE/BLLE, equal and symmetric    Psychiatric:        Speech: Speech normal.        Behavior: Behavior normal. Behavior is cooperative.     ED Results and Treatments Labs (all labs ordered are listed, but only abnormal results are displayed) Labs Reviewed  CBG MONITORING, ED - Abnormal; Notable for the following components:      Result Value   Glucose-Capillary 122 (*)    All other components within normal limits  CBC WITH DIFFERENTIAL/PLATELET  COMPREHENSIVE METABOLIC PANEL WITH GFR  Radiology CT Head Wo Contrast Result Date: 03/13/2024 CLINICAL DATA:  Headache EXAM: CT HEAD WITHOUT CONTRAST TECHNIQUE: Contiguous axial images were obtained from the base of the skull through the vertex without intravenous contrast. RADIATION DOSE REDUCTION: This exam was performed according to the departmental dose-optimization program which includes automated exposure control, adjustment of the mA and/or kV according to patient size and/or use of iterative reconstruction technique. COMPARISON:  None Available. FINDINGS: Brain: No evidence of acute infarction, hemorrhage, hydrocephalus,  extra-axial collection or mass lesion/mass effect. Vascular: No hyperdense vessel or unexpected calcification. Skull: Normal. Negative for fracture or focal lesion. Sinuses/Orbits: No acute finding. Other: None IMPRESSION: Negative non contrasted CT appearance of the brain. Electronically Signed   By: Luke Bun M.D.   On: 03/13/2024 17:14    Pertinent labs & imaging results that were available during my care of the patient were reviewed by me and considered in my medical decision making (see MDM for details).  Medications Ordered in ED Medications  dexamethasone  (DECADRON ) tablet 12 mg (12 mg Oral Given 03/13/24 1641)  acyclovir  (ZOVIRAX ) tablet 800 mg (800 mg Oral Given 03/13/24 1641)                                                                                                                                     Procedures Procedures  (including critical care time)  Medical Decision Making / ED Course    Medical Decision Making:    Wesley Banks is a 53 y.o. male with past medical history as below, significant for back pain, herpetic dermatitis, osteoarthritis who presents to the ED with complaint of facial droop. The complaint involves an extensive differential diagnosis and also carries with it a high risk of complications and morbidity.  Serious etiology was considered. Ddx includes but is not limited to: Bell's palsy, herpes zoster, tick paralysis, trigeminal neuralgia, CVA, CNS tumor, etc  Complete initial physical exam performed, notably the patient was in NAD, sitting comfortably on stretcher.    Reviewed and confirmed nursing documentation for past medical history, family history, social history.  Vital signs reviewed.    Unilateral facial paralysis consistent Bell's palsy> - No evidence of zoster otic/ophthalmicus.  No rash.  EOMI.  Neuroexam is intact other than left side facial nerve paralysis. - ambulatory w/ steady gait, no ataxia - labs from triage stable, CT  head ordered in triage stable - start pt on prednisone , acyclovir , rewetting drops, follow-up with ophthalmology      5:39 PM:  I have discussed the diagnosis/risks/treatment options with the patient.  Evaluation and diagnostic testing in the emergency department does not suggest an emergent condition requiring admission or immediate intervention beyond what has been performed at this time.  They will follow up with pcp/ophthalmology. We also discussed returning to the ED immediately if new or worsening sx occur. We discussed the sx which are most concerning (e.g., sudden worsening pain, fever,  inability to tolerate by mouth) that necessitate immediate return.    The patient appears reasonably screened and/or stabilized for discharge and I doubt any other medical condition or other [facility] requiring further screening, evaluation, or treatment in the ED at this time prior to discharge.                 Additional history obtained: -Additional history obtained from na -External records from outside source obtained and reviewed including: Chart review including previous notes, labs, imaging, consultation notes including  Allergies    Lab Tests: -I ordered, reviewed, and interpreted labs.   The pertinent results include:   Labs Reviewed  CBG MONITORING, ED - Abnormal; Notable for the following components:      Result Value   Glucose-Capillary 122 (*)    All other components within normal limits  CBC WITH DIFFERENTIAL/PLATELET  COMPREHENSIVE METABOLIC PANEL WITH GFR    Notable for labs stable  EKG   EKG Interpretation Date/Time:  Tuesday March 13 2024 14:52:42 EST Ventricular Rate:  93 PR Interval:  158 QRS Duration:  92 QT Interval:  370 QTC Calculation: 460 R Axis:   -22  Text Interpretation: Normal sinus rhythm Minimal voltage criteria for LVH, may be normal variant ( R in aVL ) Anterolateral infarct , age undetermined Abnormal ECG When compared with ECG of  13-Sep-2018 11:48, PREVIOUS ECG IS PRESENT Confirmed by Elnor Savant ([id number]) on 03/13/2024 5:38:39 PM         Imaging Studies ordered: [facility] ordered in triage I independently visualized the following imaging with scope of interpretation limited to determining acute life threatening conditions related to emergency care; findings noted above I agree with the radiologist interpretation If any imaging was obtained with contrast I closely monitored patient for any possible adverse reaction a/w contrast administration in the emergency department   Medicines ordered and prescription drug management: Meds ordered this encounter  Medications   dexamethasone  (DECADRON ) tablet 12 mg   acyclovir  (ZOVIRAX ) tablet 800 mg   DISCONTD: predniSONE  (DELTASONE ) 20 MG tablet    Sig: Take 3 tablets (60 mg total) by mouth daily for 7 days.    Dispense:  21 tablet    Refill:  0   DISCONTD: acyclovir  (ZOVIRAX ) 400 MG tablet    Sig: Take 1 tablet (400 mg total) by mouth 5 (five) times daily for 7 days.    Dispense:  35 tablet    Refill:  0   DISCONTD: Propylene Glycol (SYSTANE BALANCE) 0.6 % SOLN    Sig: Apply 1-2 drops to eye every 2 (two) hours as needed (dryness).    Dispense:  10 mL    Refill:  0   acyclovir  (ZOVIRAX ) 400 MG tablet    Sig: Take 1 tablet (400 mg total) by mouth 5 (five) times daily for 7 days.    Dispense:  35 tablet    Refill:  0   predniSONE  (DELTASONE ) 20 MG tablet    Sig: Take 3 tablets (60 mg total) by mouth daily for 7 days.    Dispense:  21 tablet    Refill:  0   Propylene Glycol (SYSTANE BALANCE) 0.6 % SOLN    Sig: Apply 1-2 drops to eye every 2 (two) hours as needed (dryness).    Dispense:  10 mL    Refill:  0    -I have reviewed the patients home medicines and have made adjustments as needed   Consultations Obtained: na   Cardiac Monitoring: Continuous pulse oximetry  interpreted by myself, 98% on RA.    Social Determinants of Health:  Diagnosis or treatment  significantly limited by social determinants of health: former smoker   Reevaluation: After the interventions noted above, I reevaluated the patient and found that they have stayed the same  Co morbidities that complicate the patient evaluation  Past Medical History:  Diagnosis Date   Back pain    Herpetic dermatitis    Osteoarthritis       Dispostion: Disposition decision including need for hospitalization was considered, and patient discharged from emergency department.    Final Clinical Impression(s) / ED Diagnoses Final diagnoses:  Bell palsy  Facial nerve paralysis          [1]  Social History Tobacco Use   Smoking status: Former    Current packs/day: 1.00    Types: Cigarettes    Passive exposure: Past   Smokeless tobacco: Never  Vaping Use   Vaping status: Never Used  Substance Use Topics   Alcohol use: Yes    Comment: 12 beers per week   Drug use: No     Elnor Jayson LABOR, DO 03/13/24 1739  "

## 2024-03-13 NOTE — Discharge Instructions (Addendum)
 It was a pleasure caring for you today in the emergency department.  Your symptoms today are likely secondary to paralysis of your facial nerve also known as Bell's palsy.  Recommend you take steroids and antiviral daily over the next week.  Please apply rewetting drops (SYSTANE BALANCE) every 2 hours as needed, particularly before going to bed at nighttime.  Recommend you tape your eye shut at nighttime to avoid the eye becoming dry.  Please follow-up with ophthalmology in the office for recheck of your cornea  Please return to the emergency department for any worsening or worrisome symptoms.

## 2024-03-13 NOTE — ED Triage Notes (Signed)
 Pt arrived via POV from work for further evaluation of left facial droop and numbness as well as a stiff neck and headache the Pt reports he began to notice yesterday evening. Pt does report symptoms began while drinking Guinness and reports any time he drinks now, the liquid drips out of the left side of his mouth.
